# Patient Record
Sex: Female | Born: 1985 | Race: Black or African American | Hispanic: No | Marital: Single | Smoking: Former smoker
Health system: Southern US, Community
[De-identification: ages and names within clinical notes are randomized; demographics above are authoritative.]

## PROBLEM LIST (undated history)

## (undated) DIAGNOSIS — Z789 Other specified health status: Secondary | ICD-10-CM

## (undated) HISTORY — PX: NO PAST SURGERIES: SHX2092

## (undated) HISTORY — DX: Other specified health status: Z78.9

---
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
## 2008-09-28 ENCOUNTER — Emergency Department (HOSPITAL_COMMUNITY): Admission: EM | Admit: 2008-09-28 | Discharge: 2008-09-28 | Payer: Self-pay | Admitting: Family Medicine

## 2018-06-17 NOTE — L&D Delivery Note (Addendum)
OB/GYN Faculty Practice Delivery Note  Carol Greene is a 33 y.o. [id number] s/p SVD at [redacted]w[redacted]d. She was admitted for PPROM on 12/18.   ROM: 28h 17m with clear fluid GBS Status:  --/POSITIVE (12/18 2233) s/p IV PenG x6 doses Maximum Maternal Temperature: 99.7   Labor Progress: . Patient arrived at 3.5 cm dilation and was augmented with pitocin.   Delivery Date/Time: 06/05/2019 at 2115 Delivery: Called to room and patient was complete and pushing. Head delivered in ROA position. Loose nuchal cord x1 present, reduced after delivery. Shoulder and body delivered in usual fashion. Infant with spontaneous cry, placed on mother's abdomen, dried and stimulated. Cord clamped x 2 after 1-minute delay, and cut by FOB. Cord blood drawn. Placenta delivered spontaneously with gentle cord traction. Fundus firm with massage and Pitocin. Labia, perineum, vagina, and cervix inspected with no lacerations.   Placenta: spontaneous, intact, 3 vessel cord Complications: none Lacerations: none EBL: 150cc Analgesia: epdiural   Infant: APGAR (1 MIN): 7   APGAR (5 MINS): 9    Weight: 6834H  Demetrius Revel, MD PGY-3  Midwife attestation: I was gloved and present for delivery in its entirety and I agree with the above resident's note.  Lajean Manes, CNM 2:01 AM

## 2019-01-26 ENCOUNTER — Encounter: Payer: Self-pay | Admitting: General Practice

## 2019-01-26 ENCOUNTER — Ambulatory Visit (INDEPENDENT_AMBULATORY_CARE_PROVIDER_SITE_OTHER): Payer: Self-pay | Admitting: *Deleted

## 2019-01-26 ENCOUNTER — Other Ambulatory Visit: Payer: Self-pay

## 2019-01-26 VITALS — BP 122/81 | HR 112 | Temp 98.5°F | Ht 64.0 in | Wt 218.8 lb

## 2019-01-26 DIAGNOSIS — Z3480 Encounter for supervision of other normal pregnancy, unspecified trimester: Secondary | ICD-10-CM

## 2019-01-26 DIAGNOSIS — Z3201 Encounter for pregnancy test, result positive: Secondary | ICD-10-CM

## 2019-01-26 DIAGNOSIS — Z3200 Encounter for pregnancy test, result unknown: Secondary | ICD-10-CM

## 2019-01-26 LAB — POCT URINE PREGNANCY: Preg Test, Ur: POSITIVE — AB

## 2019-01-26 MED ORDER — ONE-A-DAY WOMENS PRENATAL 1 28-0.8-235 MG PO CAPS: 1.0000 | ORAL_CAPSULE | Freq: Every day | ORAL | 0 refills | Status: DC

## 2019-01-26 NOTE — Progress Notes (Signed)
   PRENATAL INTAKE SUMMARY  Carol Greene presents today New OB Nurse Interview.  OB History    Gravida  3   Para  2   Term  2   Preterm      AB      Living  2     SAB      TAB      Ectopic      Multiple      Live Births  2          I have reviewed the patient's medical, obstetrical, social, and family histories, medications, and available lab results.  SUBJECTIVE She has no unusual complaints.  OBJECTIVE Initial nurse interview (New OB).  EDD: 08/04/2019 by LMP GA:[redacted]w[redacted]d [id number]  GENERAL APPEARANCE: alert, well appearing, in no apparent distress, oriented to person, place and time   ASSESSMENT Normal pregnancy Positive Pregnancy test  PLAN Prenatal care Continue PNV-Sample One A Day given Lab work to be completed at next visit.  Derl Barrow, RN

## 2019-02-11 ENCOUNTER — Ambulatory Visit (INDEPENDENT_AMBULATORY_CARE_PROVIDER_SITE_OTHER): Payer: Medicaid Other | Admitting: Advanced Practice Midwife

## 2019-02-11 ENCOUNTER — Encounter: Payer: Self-pay | Admitting: General Practice

## 2019-02-11 ENCOUNTER — Other Ambulatory Visit: Payer: Self-pay

## 2019-02-11 ENCOUNTER — Other Ambulatory Visit (HOSPITAL_COMMUNITY)
Admission: RE | Admit: 2019-02-11 | Discharge: 2019-02-11 | Disposition: A | Discharge status: Home / Self Care | Payer: Medicaid Other | Source: Ambulatory Visit | Attending: Advanced Practice Midwife | Admitting: Advanced Practice Midwife

## 2019-02-11 VITALS — BP 116/76 | HR 104 | Temp 98.5°F | Wt 224.0 lb

## 2019-02-11 DIAGNOSIS — Z3480 Encounter for supervision of other normal pregnancy, unspecified trimester: Secondary | ICD-10-CM | POA: Insufficient documentation

## 2019-02-11 DIAGNOSIS — O99212 Obesity complicating pregnancy, second trimester: Secondary | ICD-10-CM | POA: Diagnosis not present

## 2019-02-11 DIAGNOSIS — Z3A15 15 weeks gestation of pregnancy: Secondary | ICD-10-CM | POA: Diagnosis not present

## 2019-02-11 DIAGNOSIS — O99210 Obesity complicating pregnancy, unspecified trimester: Secondary | ICD-10-CM

## 2019-02-11 DIAGNOSIS — Z87891 Personal history of nicotine dependence: Secondary | ICD-10-CM

## 2019-02-11 DIAGNOSIS — Z3482 Encounter for supervision of other normal pregnancy, second trimester: Secondary | ICD-10-CM | POA: Diagnosis not present

## 2019-02-11 MED ORDER — ASPIRIN EC 81 MG PO TBEC: 81.0000 mg | DELAYED_RELEASE_TABLET | Freq: Every day | ORAL | 2 refills | Status: DC

## 2019-02-11 NOTE — Progress Notes (Signed)
History:   Darleny Sem is a 33 y.o. G3P2002 at 22w1dby LMP being seen today for her first obstetrical visit.  Her obstetrical history is significant for obesity, term uncomplicated SVD x 2. Patient does not intend to breast feed. Pregnancy history fully reviewed.  Patient reports no complaints. She endorses history of 1/2 PPD cigarette smoking, discontinued about 3 weeks ago. She lives with her fiance and children. Denies SI, HI, IPV.     HISTORY: OB History  Gravida Para Term Preterm AB Living  '3 2 2 ' 0 0 2  SAB TAB Ectopic Multiple Live Births  0 0 0 0 2    # Outcome Date GA Lbr Len/2nd Weight Sex Delivery Anes PTL Lv  3 Current           2 Term 05/29/17 466w0d7 lb 6 oz (3.345 kg) M Vag-Spont EPI N LIV  1 Term 01/25/05 4058w0d lb 14 oz (3.572 kg) F Vag-Spont EPI N LIV    [place]  Past Medical History:  Diagnosis Date  . Medical history non-contributory    Past Surgical History:  Procedure Laterality Date  . NO PAST SURGERIES     No family history on file. Social History   Tobacco Use  . Smoking status: Never Smoker  . Smokeless tobacco: Never Used  Substance Use Topics  . Alcohol use: Not Currently  . Drug use: Yes    Types: Marijuana    Comment: 1 month ago   No Known Allergies Current Outpatient Medications on File Prior to Visit  Medication Sig Dispense Refill  . Prenat-Fe Carbonyl-FA-Omega 3 (ONE-A-DAY WOMENS PRENATAL 1) 28-0.8-235 MG CAPS Take 1 capsule by mouth daily. 30 capsule 0   No current facility-administered medications on file prior to visit.     Review of Systems Pertinent items noted in HPI and remainder of comprehensive ROS otherwise negative. Physical Exam:   Vitals:   02/11/19 0809  BP: 116/76  Pulse: (!) 104  Temp: 98.5 F (36.9 C)  Weight: 224 lb (101.6 kg)   Fetal Heart Rate (bpm): 146 Uterus:     Pelvic Exam: Perineum: no hemorrhoids, normal perineum   Vulva:  normal external genitalia, no lesions   Vagina:  normal mucosa, thick white discharge throughout vault, recent intercourse per patient   Cervix: no lesions and normal, pap smear done.    Adnexa: normal adnexa and no mass, fullness, tenderness   Bony Pelvis: average  System: General: well-developed, well-nourished female in no acute distress   Breasts:  normal appearance, no masses or tenderness bilaterally   Skin: normal coloration and turgor, no rashes   Neurologic: oriented, normal, negative, normal mood   Extremities: normal strength, tone, and muscle mass, ROM of all joints is normal   HEENT PERRLA, extraocular movement intact and sclera clear, anicteric   Mouth/Teeth mucous membranes moist, pharynx normal without lesions and dental hygiene good   Neck supple and no masses   Cardiovascular: regular rate and rhythm   Respiratory:  no respiratory distress, normal breath sounds   Abdomen: soft, non-tender; bowel sounds normal; no masses,  no organomegaly     Assessment:    Pregnancy: [id number] Active Problem List   Diagnosis Date Noted  . Supervision of other normal pregnancy, antepartum 01/26/2019     Indications for ASA therapy (per uptodate)  Two or more of the following: Nulliparity No Obesity (body mass  index >30 kg/m2) Yes Family history of preeclampsia in mother or sister No Age ?35 years No Sociodemographic characteristics (African American race, low socioeconomic level) Yes Personal risk factors (eg, previous pregnancy with low birth weight or small for gestational age infant, previous adverse pregnancy outcome [eg, stillbirth], interval >10 years between pregnancies) No   Plan:    1. Supervision of other normal pregnancy, antepartum - No complaints or concerns, routine care - Previous children delivered in [place], ROI completed this morning - Daily ASA 81 mg for PEC risk reduction. Rx to pharmacy - Obstetric Panel, Including HIV - Culture, OB Urine -  Genetic Screening - Cytology - PAP( [place]) - [place] MFM OB COMP + 34 WK; Future - Cervicovaginal ancillary only( [place]) - Enroll Patient in Babyscripts  2. Obesity affecting pregnancy in second trimester - Discussed target weight gain, meal planning - Hemoglobin A1c - TSH - Protein / creatinine ratio, urine - Comp Met (CMET)  Initial labs drawn. Continue prenatal vitamins. Genetic Screening discussed, First trimester screen, Quad screen and NIPS: ordered. Ultrasound discussed; fetal anatomic survey: ordered. Problem list reviewed and updated. The nature of [facility] with multiple MDs and other Advanced Practice Providers was explained to patient; also emphasized that residents, students are part of our team. Routine obstetric precautions reviewed. Return in about 4 weeks (around 03/11/2019) for Virtual 20 weeks appt .     Mallie Snooks, MSN, CNM Certified Nurse Midwife, [facility] for [employer], [place] [facility] 02/11/19 8:37 AM

## 2019-02-11 NOTE — Patient Instructions (Signed)

## 2019-02-11 NOTE — Progress Notes (Deleted)
.  baby

## 2019-02-12 ENCOUNTER — Encounter: Payer: Self-pay | Admitting: General Practice

## 2019-02-12 LAB — COMPREHENSIVE METABOLIC PANEL
ALT: 29 IU/L (ref 0–32)
AST: 30 IU/L (ref 0–40)
Albumin/Globulin Ratio: 2 (ref 1.2–2.2)
Albumin: 4.1 g/dL (ref 3.8–4.8)
Alkaline Phosphatase: 54 IU/L (ref 39–117)
BUN/Creatinine Ratio: 7 — ABNORMAL LOW (ref 9–23)
BUN: 4 mg/dL — ABNORMAL LOW (ref 6–20)
Bilirubin Total: <0.2 mg/dL (ref 0.0–1.2)
CO2: 20 mmol/L (ref 20–29)
Calcium: 9 mg/dL (ref 8.7–10.2)
Chloride: 102 mmol/L (ref 96–106)
Creatinine, Ser: 0.54 mg/dL — ABNORMAL LOW (ref 0.57–1.00)
GFR calc Af Amer: 144 mL/min/{1.73_m2} (ref >59)
GFR calc non Af Amer: 125 mL/min/{1.73_m2} (ref >59)
Globulin, Total: 2.1 g/dL (ref 1.5–4.5)
Glucose: 97 mg/dL (ref 65–99)
Potassium: 3.7 mmol/L (ref 3.5–5.2)
Sodium: 138 mmol/L (ref 134–144)
Total Protein: 6.2 g/dL (ref 6.0–8.5)

## 2019-02-12 LAB — OBSTETRIC PANEL, INCLUDING HIV
Antibody Screen: NEGATIVE
Basophils Absolute: 0 10*3/uL (ref 0.0–0.2)
Basos: 0 %
EOS (ABSOLUTE): 0.2 10*3/uL (ref 0.0–0.4)
Eos: 2 %
HIV Screen 4th Generation wRfx: NONREACTIVE
Hematocrit: 30.1 % — ABNORMAL LOW (ref 34.0–46.6)
Hemoglobin: 10.1 g/dL — ABNORMAL LOW (ref 11.1–15.9)
Hepatitis B Surface Ag: NEGATIVE
Immature Grans (Abs): 0.1 10*3/uL (ref 0.0–0.1)
Immature Granulocytes: 1 %
Lymphocytes Absolute: 2.4 10*3/uL (ref 0.7–3.1)
Lymphs: 30 %
MCH: 32.2 pg (ref 26.6–33.0)
MCHC: 33.6 g/dL (ref 31.5–35.7)
MCV: 96 fL (ref 79–97)
Monocytes Absolute: 0.6 10*3/uL (ref 0.1–0.9)
Monocytes: 7 %
Neutrophils Absolute: 4.9 10*3/uL (ref 1.4–7.0)
Neutrophils: 60 %
Platelets: 267 10*3/uL (ref 150–450)
RBC: 3.14 x10E6/uL — ABNORMAL LOW (ref 3.77–5.28)
RDW: 13.3 % (ref 11.7–15.4)
RPR Ser Ql: NONREACTIVE
Rh Factor: POSITIVE
Rubella Antibodies, IGG: 1.38 index (ref >0.99)
WBC: 8.1 10*3/uL (ref 3.4–10.8)

## 2019-02-12 LAB — PROTEIN / CREATININE RATIO, URINE
Creatinine, Urine: 50 mg/dL
Protein, Ur: 7.8 mg/dL
Protein/Creat Ratio: 156 mg/g creat (ref 0–200)

## 2019-02-12 LAB — TSH: TSH: 2.95 u[IU]/mL (ref 0.450–4.500)

## 2019-02-12 LAB — HEMOGLOBIN A1C
Est. average glucose Bld gHb Est-mCnc: 108 mg/dL
Hgb A1c MFr Bld: 5.4 % (ref 4.8–5.6)

## 2019-02-13 LAB — CULTURE, OB URINE

## 2019-02-13 LAB — URINE CULTURE, OB REFLEX: Organism ID, Bacteria: NO GROWTH

## 2019-02-15 LAB — CYTOLOGY - PAP
Diagnosis: NEGATIVE
HPV: DETECTED — AB

## 2019-02-17 ENCOUNTER — Encounter: Payer: Self-pay | Admitting: General Practice

## 2019-02-17 ENCOUNTER — Other Ambulatory Visit: Payer: Self-pay | Admitting: Advanced Practice Midwife

## 2019-02-17 DIAGNOSIS — B9689 Other specified bacterial agents as the cause of diseases classified elsewhere: Secondary | ICD-10-CM

## 2019-02-17 LAB — CERVICOVAGINAL ANCILLARY ONLY
Candida vaginitis: NEGATIVE
Chlamydia: NEGATIVE
Neisseria Gonorrhea: NEGATIVE
Trichomonas: NEGATIVE

## 2019-02-17 MED ORDER — METRONIDAZOLE 500 MG PO TABS: 500.0000 mg | ORAL_TABLET | Freq: Two times a day (BID) | ORAL | 0 refills | Status: DC

## 2019-02-17 NOTE — Progress Notes (Unsigned)
Positive BV 08/27. Rx to pharmacy on record. Pt notified via active [place], MSN, CNM Certified Nurse Midwife, [facility] for [employer], [place] [facility] 02/17/19 1:45 PM

## 2019-02-18 NOTE — Telephone Encounter (Signed)
Return call to patient regarding lab result. Pt wanted to know about the Panor[facility] results. Advised patient that results are not back yet, hopefully tomorrow. Once results are back, will give patient a call.   Derl Barrow, RN

## 2019-02-18 NOTE — Telephone Encounter (Signed)
-----   Message from Maurine Minister, [place] sent at 02/18/2019 10:54 AM EDT ----- Regarding: results Patient wants lab results explained to her.

## 2019-02-19 ENCOUNTER — Encounter: Payer: Self-pay | Admitting: General Practice

## 2019-03-09 ENCOUNTER — Other Ambulatory Visit: Payer: Self-pay | Admitting: *Deleted

## 2019-03-09 DIAGNOSIS — Z3480 Encounter for supervision of other normal pregnancy, unspecified trimester: Secondary | ICD-10-CM

## 2019-03-09 NOTE — Progress Notes (Signed)
Received denial from [facility] for previous ultrasound ordered ob complete greater than 14 weeks. Order changed to detailed ultrasound greater than 14 weeks.  Derl Barrow, RN

## 2019-03-10 ENCOUNTER — Ambulatory Visit (HOSPITAL_COMMUNITY)
Admission: RE | Admit: 2019-03-10 | Discharge: 2019-03-10 | Disposition: A | Discharge status: Home / Self Care | Payer: Medicaid Other | Source: Ambulatory Visit | Attending: Obstetrics and Gynecology | Admitting: Obstetrics and Gynecology

## 2019-03-10 ENCOUNTER — Encounter: Payer: Self-pay | Admitting: Advanced Practice Midwife

## 2019-03-10 ENCOUNTER — Telehealth (INDEPENDENT_AMBULATORY_CARE_PROVIDER_SITE_OTHER): Payer: Medicaid Other | Admitting: Advanced Practice Midwife

## 2019-03-10 ENCOUNTER — Encounter (HOSPITAL_COMMUNITY): Payer: Self-pay

## 2019-03-10 ENCOUNTER — Ambulatory Visit (HOSPITAL_COMMUNITY): Payer: Medicaid Other | Admitting: *Deleted

## 2019-03-10 ENCOUNTER — Other Ambulatory Visit: Payer: Self-pay

## 2019-03-10 DIAGNOSIS — Z3A23 23 weeks gestation of pregnancy: Secondary | ICD-10-CM | POA: Diagnosis not present

## 2019-03-10 DIAGNOSIS — O99212 Obesity complicating pregnancy, second trimester: Secondary | ICD-10-CM | POA: Diagnosis not present

## 2019-03-10 DIAGNOSIS — Z3480 Encounter for supervision of other normal pregnancy, unspecified trimester: Secondary | ICD-10-CM

## 2019-03-10 DIAGNOSIS — O289 Unspecified abnormal findings on antenatal screening of mother: Secondary | ICD-10-CM | POA: Diagnosis not present

## 2019-03-10 DIAGNOSIS — O99012 Anemia complicating pregnancy, second trimester: Secondary | ICD-10-CM

## 2019-03-10 DIAGNOSIS — O99210 Obesity complicating pregnancy, unspecified trimester: Secondary | ICD-10-CM | POA: Insufficient documentation

## 2019-03-10 DIAGNOSIS — Z3482 Encounter for supervision of other normal pregnancy, second trimester: Secondary | ICD-10-CM

## 2019-03-10 NOTE — Progress Notes (Signed)
   TELEHEALTH VIRTUAL OBSTETRICS VISIT ENCOUNTER NOTE  I connected with Carol Greene  on 03/10/19 at  2:50 PM EDT by telephone at home and verified that I am speaking with the correct person using two identifiers.   I discussed the limitations, risks, security and privacy concerns of performing an evaluation and management service by telephone and the availability of in person appointments. I also discussed with the patient that there may be a patient responsible charge related to this service. The patient expressed understanding and agreed to proceed.  Subjective:  Carol Greene is a 33 y.o. G3P2002 at [redacted]w[redacted]d being followed for ongoing prenatal care.  She is currently monitored for the following issues for this low-risk pregnancy and has Supervision of other normal pregnancy, antepartum; Obesity in pregnancy; and Hx of tobacco use, presenting hazards to health on their problem list.  Patient reports no complaints. Reports fetal movement. Denies any contractions, bleeding or leaking of fluid.   The following portions of the patient's history were reviewed and updated as appropriate: allergies, current medications, past family history, past medical history, past social history, past surgical history and problem list.   Objective:   General:  Alert, oriented and cooperative.   Mental Status: Normal mood and affect perceived. Normal judgment and thought content.  Rest of physical exam deferred due to type of encounter  Assessment and Plan:  Pregnancy: G3P2002 at [redacted]w[redacted]d 1. Supervision of other normal pregnancy, antepartum - Patient had anatomy scan today, no abnormal findings, declined amnio or genetic counselor  - CBC; Future - Glucose Tolerance, 2 Hours w/1 Hour; Future - RPR; Future - HIV antibody (with reflex); Future  Preterm labor symptoms and general obstetric precautions including but not limited to vaginal bleeding, contractions, leaking of fluid and fetal movement were reviewed in  detail with the patient.  I discussed the assessment and treatment plan with the patient. The patient was provided an opportunity to ask questions and all were answered. The patient agreed with the plan and demonstrated an understanding of the instructions. The patient was advised to call back or seek an in-person office evaluation/go to [facility] at [facility] for any urgent or concerning symptoms. Please refer to After Visit Summary for other counseling recommendations.   I provided 10 minutes of non-face-to-face time during this encounter.  Return in about 4 weeks (around 04/07/2019) for in person visit for 28 week labs and GTT .  No future appointments.  Marcille Buffy DNP, CNM  03/10/19  3:10 PM  [facility]

## 2019-04-07 ENCOUNTER — Ambulatory Visit (INDEPENDENT_AMBULATORY_CARE_PROVIDER_SITE_OTHER): Payer: Medicaid Other

## 2019-04-07 ENCOUNTER — Other Ambulatory Visit: Payer: Self-pay

## 2019-04-07 VITALS — BP 119/76 | HR 98 | Temp 99.0°F | Wt 228.0 lb

## 2019-04-07 DIAGNOSIS — M25559 Pain in unspecified hip: Secondary | ICD-10-CM

## 2019-04-07 DIAGNOSIS — O99210 Obesity complicating pregnancy, unspecified trimester: Secondary | ICD-10-CM

## 2019-04-07 DIAGNOSIS — Z3480 Encounter for supervision of other normal pregnancy, unspecified trimester: Secondary | ICD-10-CM | POA: Diagnosis not present

## 2019-04-07 DIAGNOSIS — O26892 Other specified pregnancy related conditions, second trimester: Secondary | ICD-10-CM

## 2019-04-07 DIAGNOSIS — Z23 Encounter for immunization: Secondary | ICD-10-CM

## 2019-04-07 DIAGNOSIS — O99012 Anemia complicating pregnancy, second trimester: Secondary | ICD-10-CM

## 2019-04-07 DIAGNOSIS — O99212 Obesity complicating pregnancy, second trimester: Secondary | ICD-10-CM

## 2019-04-07 DIAGNOSIS — Z3A27 27 weeks gestation of pregnancy: Secondary | ICD-10-CM

## 2019-04-07 MED ORDER — ONE-A-DAY WOMENS PRENATAL 1 28-0.8-235 MG PO CAPS: 1.0000 | ORAL_CAPSULE | Freq: Every day | ORAL | 11 refills | Status: AC

## 2019-04-07 NOTE — Addendum Note (Signed)
Addended by: Derl Barrow on: 04/07/2019 10:24 AM   Modules accepted: Orders

## 2019-04-07 NOTE — Progress Notes (Signed)
   PRENATAL VISIT NOTE  Subjective:  Carol Greene is a 33 y.o. G3P2002 at [redacted]w[redacted]d who presents today for routine prenatal care.  She is currently being monitored for supervision of a low-risk pregnancy with problems as listed below.  Patient endorses fetal movement.  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  She reports the pain has improved, at night, with usage of a body pillow. Patient denies vaginal concerns including discharge, bleeding, leaking, itching, and burning.   Patient Active Problem List   Diagnosis Date Noted  . Obesity in pregnancy 02/11/2019  . Hx of tobacco use, presenting hazards to health 02/11/2019  . Supervision of other normal pregnancy, antepartum 01/26/2019    The following portions of the patient's history were reviewed and updated as appropriate: allergies, current medications, past family history, past medical history, past social history, past surgical history and problem list. Problem list updated.  Objective:   Vitals:   04/07/19 0814  BP: 119/76  Pulse: 98  Temp: 99 F (37.2 C)  Weight: 228 lb (103.4 kg)    Fetal Status: Fetal Heart Rate (bpm): 140   Movement: Present     General:  Alert, oriented and cooperative. Patient is in no acute distress.  Skin: Skin is warm and dry.   Cardiovascular: Regular rate and rhythm.  Respiratory: Normal respiratory effort. CTA-Bilaterally  Abdomen: Soft, gravid, appropriate for gestational age.  Pelvic: Cervical exam deferred        Extremities: Normal range of motion.  Edema: None  Mental Status: Normal mood and affect. Normal behavior. Normal judgment and thought content.   Assessment and Plan:  Pregnancy: G3P2002 at [redacted]w[redacted]d  1. Supervision of other normal pregnancy, antepartum -Anticipatory guidance for upcoming appts. -Reviewed labs to be drawn today.  Informed that results to be released to mychart if normal, but would call and discuss abnormal results. -Reviewed vital signs particularly low grade  fever (99). Encouraged to monitor for new or worsening symptoms and report as appropriate.  - Glucose Tolerance, 2 Hours w/1 Hour - HIV Antibody (routine testing w rflx) - RPR - CBC - Tdap vaccine greater than or equal to 7yo IM  2. Pregnancy related hip pain in third trimester -Discussed usage of pregnancy support band/belt throughout the day. -Reviewed types and costs on Internet with patient. -Discussed usage of tylenol for relief of pain as well as warm showers/compresses and rest.    3. Need for immunization against influenza -Given Today - Flu Vaccine QUAD 36+ mos IM  4. Need for tetanus, diphtheria, and acellular pertussis (Tdap) vaccine in patient of adolescent age or older -Given Today   Preterm labor symptoms and general obstetric precautions including but not limited to vaginal bleeding, contractions, leaking of fluid and fetal movement were reviewed with the patient.  Please refer to After Visit Summary for other counseling recommendations.  Return in about 5 weeks (around 05/12/2019) for LR-ROB via Virtual Visit.  No future appointments.  Maryann Conners, CNM 04/07/2019, 8:33 AM

## 2019-04-08 LAB — CBC
Hematocrit: 28.4 % — ABNORMAL LOW (ref 34.0–46.6)
Hemoglobin: 9.4 g/dL — ABNORMAL LOW (ref 11.1–15.9)
MCH: 31.6 pg (ref 26.6–33.0)
MCHC: 33.1 g/dL (ref 31.5–35.7)
MCV: 96 fL (ref 79–97)
Platelets: 234 10*3/uL (ref 150–450)
RBC: 2.97 x10E6/uL — ABNORMAL LOW (ref 3.77–5.28)
RDW: 12.2 % (ref 11.7–15.4)
WBC: 10.4 10*3/uL (ref 3.4–10.8)

## 2019-04-08 LAB — GLUCOSE TOLERANCE, 2 HOURS W/ 1HR
Glucose, 1 hour: 203 mg/dL — ABNORMAL HIGH (ref 65–179)
Glucose, 2 hour: 137 mg/dL (ref 65–152)
Glucose, Fasting: 89 mg/dL (ref 65–91)

## 2019-04-08 LAB — RPR: RPR Ser Ql: NONREACTIVE

## 2019-04-08 LAB — HIV ANTIBODY (ROUTINE TESTING W REFLEX): HIV Screen 4th Generation wRfx: NONREACTIVE

## 2019-04-11 MED ORDER — FERROUS SULFATE 325 (65 FE) MG PO TABS: 325.0000 mg | ORAL_TABLET | Freq: Two times a day (BID) | ORAL | 1 refills | Status: AC

## 2019-04-11 NOTE — Progress Notes (Signed)
[name],  This patient needs to be set up with diabetic education, a glucometer, and strips.  I have notified her via mychart of her results, but please reiterate when you call to schedule her referral.  Thanks,  Janett Billow

## 2019-04-11 NOTE — Addendum Note (Signed)
Addended by: Gavin Pound L on: 04/11/2019 03:52 AM   Modules accepted: Orders

## 2019-04-12 ENCOUNTER — Other Ambulatory Visit: Payer: Self-pay | Admitting: *Deleted

## 2019-04-12 DIAGNOSIS — Z3480 Encounter for supervision of other normal pregnancy, unspecified trimester: Secondary | ICD-10-CM

## 2019-04-12 DIAGNOSIS — O24419 Gestational diabetes mellitus in pregnancy, unspecified control: Secondary | ICD-10-CM | POA: Insufficient documentation

## 2019-04-12 DIAGNOSIS — O24410 Gestational diabetes mellitus in pregnancy, diet controlled: Secondary | ICD-10-CM

## 2019-04-12 MED ORDER — ACCU-CHEK GUIDE W/DEVICE KIT: 1.0000 | PACK | Freq: Four times a day (QID) | 0 refills | Status: AC

## 2019-04-12 MED ORDER — ACCU-CHEK AVIVA PLUS VI STRP: ORAL_STRIP | 12 refills | Status: DC

## 2019-04-12 MED ORDER — ACCU-CHEK FASTCLIX LANCETS MISC: 1.0000 | Freq: Four times a day (QID) | 12 refills | Status: AC

## 2019-04-12 NOTE — Progress Notes (Signed)
Diabetic supplies sent to pharmacy per Gavin Pound, CNM.  Derl Barrow, RN

## 2019-04-13 ENCOUNTER — Encounter: Payer: Self-pay | Admitting: General Practice

## 2019-04-22 ENCOUNTER — Other Ambulatory Visit: Payer: Self-pay | Admitting: Lactation Services

## 2019-04-22 ENCOUNTER — Ambulatory Visit: Payer: Medicaid Other | Admitting: *Deleted

## 2019-04-22 ENCOUNTER — Encounter: Payer: Medicaid Other | Attending: Obstetrics and Gynecology | Admitting: *Deleted

## 2019-04-22 ENCOUNTER — Other Ambulatory Visit: Payer: Self-pay

## 2019-04-22 DIAGNOSIS — O24419 Gestational diabetes mellitus in pregnancy, unspecified control: Secondary | ICD-10-CM | POA: Diagnosis not present

## 2019-04-22 DIAGNOSIS — Z713 Dietary counseling and surveillance: Secondary | ICD-10-CM | POA: Insufficient documentation

## 2019-04-22 MED ORDER — ACCU-CHEK GUIDE VI STRP: ORAL_STRIP | 12 refills | Status: AC

## 2019-04-22 NOTE — Progress Notes (Signed)
  Patient was seen on 04/22/2019 for Gestational Diabetes self-management. EDD 07/06/2019. Patient states no history of GDM. Diet history obtained. Patient eats fair variety of all food groups but typically skips either breakfast or lunch and her dinner meal is typically after 9 PM. 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  Beverages include water and milk with occasional OJ.  The following learning objectives were met by the patient :   States the definition of Gestational Diabetes  States why dietary management is important in controlling blood glucose  Describes the effects of carbohydrates on blood glucose levels  Demonstrates ability to create a balanced meal plan  Demonstrates carbohydrate counting   States when to check blood glucose levels  Demonstrates proper blood glucose monitoring techniques  States the effect of stress and exercise on blood glucose levels  States the importance of limiting caffeine and abstaining from alcohol and smoking  Plan:  Aim for 3 Carb Choices per meal (45 grams) +/- 1 either way  Aim for 1-2 Carbs per snack Begin reading food labels for Total Carbohydrate of foods If OK with your MD, consider  increasing your activity level by walking, Arm Chair Exercises or other activity daily as tolerated Begin checking BG before breakfast and 2 hours after first bite of breakfast, lunch and dinner as directed by MD  Bring Log Book/Sheet and meter to every medical appointment OR use Baby Scripts (see below) Baby Scripts:  Patient was introduced to [employer] and plans to use as record of BG electronically  Take medication if directed by MD  Blood glucose monitor Rx called into pharmacy by her OB office but she states the pharmacist stated it would cost $100 so she did not pick it up. I have provided her with the BIN# flyer to take back to her pharmacy for a free meter: Accu Check Guide with Fast Clix drums Patient  instructed to test pre breakfast and 2 hours after each meal as directed by MD  Patient instructed to monitor glucose levels: FBS: 60 - 95 mg/dl 2 hour: <120 mg/dl  Patient received the following handouts:  Nutrition Diabetes and Pregnancy  Carbohydrate Counting List  Patient will be seen for follow-up as needed.

## 2019-04-30 ENCOUNTER — Encounter: Payer: Self-pay | Admitting: Family Medicine

## 2019-05-05 ENCOUNTER — Other Ambulatory Visit: Payer: Self-pay | Admitting: *Deleted

## 2019-05-05 DIAGNOSIS — O24419 Gestational diabetes mellitus in pregnancy, unspecified control: Secondary | ICD-10-CM

## 2019-05-12 ENCOUNTER — Ambulatory Visit (INDEPENDENT_AMBULATORY_CARE_PROVIDER_SITE_OTHER): Payer: Medicaid Other | Admitting: Obstetrics and Gynecology

## 2019-05-12 ENCOUNTER — Other Ambulatory Visit: Payer: Self-pay

## 2019-05-12 VITALS — BP 107/67 | HR 108 | Temp 98.2°F | Wt 230.8 lb

## 2019-05-12 DIAGNOSIS — Z3480 Encounter for supervision of other normal pregnancy, unspecified trimester: Secondary | ICD-10-CM

## 2019-05-12 DIAGNOSIS — Z3A32 32 weeks gestation of pregnancy: Secondary | ICD-10-CM

## 2019-05-12 DIAGNOSIS — O99210 Obesity complicating pregnancy, unspecified trimester: Secondary | ICD-10-CM

## 2019-05-12 DIAGNOSIS — O24410 Gestational diabetes mellitus in pregnancy, diet controlled: Secondary | ICD-10-CM

## 2019-05-12 DIAGNOSIS — O99213 Obesity complicating pregnancy, third trimester: Secondary | ICD-10-CM

## 2019-05-13 NOTE — Progress Notes (Signed)
    [facility] PREGNANCY OFFICE VISIT Patient name: Carol Greene MRN [id number]  Date of birth: June 03, 1986 Chief Complaint:   Routine Prenatal Visit  History of Present Illness:   Carol Greene is a 33 y.o. G74P2002 female at [redacted]w[redacted]d with an Estimated Date of Delivery: 07/06/19 being seen today for ongoing management of a high-risk pregnancy complicated by B8GY Today she reports no complaints.She reports she does not like the diabetic diet, because she feels "like she is starving the baby." She has not been logging her blood sugars in BabyScripts, but she did bring a written log. Contractions: Irregular and (+) pelvic pressure. Vag. Bleeding: None.  Movement: Present. denies leaking of fluid.  Review of Systems:   Pertinent items are noted in HPI Denies abnormal vaginal discharge w/ itching/odor/irritation, headaches, visual changes, shortness of breath, chest pain, abdominal pain, severe nausea/vomiting, or problems with urination or bowel movements unless otherwise stated above. Pertinent History Reviewed:  Reviewed past medical,surgical, social, obstetrical and family history.  Reviewed problem list, medications and allergies. Physical Assessment:   Vitals:   05/12/19 1107  BP: 107/67  Pulse: (!) 108  Temp: 98.2 F (36.8 C)  Weight: 230 lb 12.8 oz (104.7 kg)  Body mass index is 39.62 kg/m.           Physical Examination:   General appearance: alert, well appearing, and in no distress  Mental status: alert, oriented to person, place, and time  Skin: warm & dry   Extremities: Edema: None    Cardiovascular: normal heart rate noted  Respiratory: normal respiratory effort, no distress  Abdomen: gravid, soft, non-tender  Pelvic: Cervical exam deferred    Fetal Status: Fetal Heart Rate (bpm): 147 Fundal Height: 35 cm Movement: Present    Fetal Surveillance Testing today: none   Assessment & Plan:  1) High-risk pregnancy G3P2002 at [redacted]w[redacted]d with an Estimated Date of Delivery: 07/06/19    2) Supervision of other normal pregnancy, antepartum  3) Diet controlled gestational diabetes mellitus (GDM), antepartum  - Review BS log: FBS range = 74-106 (5 out of 9 values over 95)  2 hr PP range = 70-118 - Extensive discussion about food choices and logging blood sugars in Baby Scripts  Had appt with diabetic educator 04/22/19 - Advised that if FBS ranges continue to be too high, will have to start on Metformin and transfer to HR office. - [place] MFM OB FOLLOW UP @ 36 wks  4) Obesity in pregnancy   Meds: none  Labs/procedures today: none  Treatment Plan:  Restructure food choices and begin logging BS in [employer]  Reviewed: Preterm labor symptoms and general obstetric precautions including but not limited to vaginal bleeding, contractions, leaking of fluid and fetal movement were reviewed in detail with the patient.  All questions were answered. Has home bp cuff. Check bp weekly, let us know if >140/90.   Follow-up: Return in about 2 weeks (around 05/26/2019) for Return OB visit.  Orders Placed This Encounter  Procedures  . [place] MFM OB FOLLOW UP   Laury Deep MSN, CNM 05/12/2019

## 2019-05-18 ENCOUNTER — Encounter: Payer: Self-pay | Admitting: General Practice

## 2019-05-28 ENCOUNTER — Other Ambulatory Visit: Payer: Self-pay

## 2019-05-28 ENCOUNTER — Ambulatory Visit (INDEPENDENT_AMBULATORY_CARE_PROVIDER_SITE_OTHER): Payer: Medicaid Other | Admitting: Advanced Practice Midwife

## 2019-05-28 ENCOUNTER — Encounter: Payer: Self-pay | Admitting: Advanced Practice Midwife

## 2019-05-28 VITALS — BP 114/77 | HR 86 | Wt 229.0 lb

## 2019-05-28 DIAGNOSIS — O24419 Gestational diabetes mellitus in pregnancy, unspecified control: Secondary | ICD-10-CM

## 2019-05-28 DIAGNOSIS — O0993 Supervision of high risk pregnancy, unspecified, third trimester: Secondary | ICD-10-CM

## 2019-05-28 DIAGNOSIS — O0990 Supervision of high risk pregnancy, unspecified, unspecified trimester: Secondary | ICD-10-CM

## 2019-05-28 DIAGNOSIS — Z3A34 34 weeks gestation of pregnancy: Secondary | ICD-10-CM

## 2019-05-28 NOTE — Progress Notes (Signed)
   PRENATAL VISIT NOTE  Subjective:  Carol Greene is a 33 y.o. G3P2002 at [redacted]w[redacted]d being seen today for ongoing prenatal care.  She is currently monitored for the following issues for this high-risk pregnancy and has Supervision of other normal pregnancy, antepartum; Obesity in pregnancy; Hx of tobacco use, presenting hazards to health; and Gestational diabetes mellitus (GDM), antepartum on their problem list.  Patient reports no complaints.  Contractions: Not present.  .  Movement: Present. Denies leaking of fluid.   The following portions of the patient's history were reviewed and updated as appropriate: allergies, current medications, past family history, past medical history, past social history, past surgical history and problem list.   Objective:   Vitals:   05/28/19 0859  BP: 114/77  Pulse: 86  Weight: 229 lb (103.9 kg)    Fetal Status: Fetal Heart Rate (bpm): 144 Fundal Height: 35 cm Movement: Present     General:  Alert, oriented and cooperative. Patient is in no acute distress.  Skin: Skin is warm and dry. No rash noted.   Cardiovascular: Normal heart rate noted  Respiratory: Normal respiratory effort, no problems with respiration noted  Abdomen: Soft, gravid, appropriate for gestational age.  Pain/Pressure: Absent     Pelvic: Cervical exam deferred        Extremities: Normal range of motion.  Edema: None  Mental Status: Normal mood and affect. Normal behavior. Normal judgment and thought content.   Assessment and Plan:  Pregnancy: G3P2002 at [redacted]w[redacted]d 1. Supervision of high risk pregnancy, antepartum   2. Gestational diabetes mellitus (GDM) in third trimester, gestational diabetes method of control unspecified - Patient reports that her blood sugars have improved. She has been using better portion control and food choices at this time.  - Patient has not checked fasting glucose since her last visit.  - All PP except one have been in range. 1 out of 36. Only one fasting  level since last visit, but it is less than 90.  - Will have patient schedule virtual visit for one week so we can FU with her fasting levels.   Preterm labor symptoms and general obstetric precautions including but not limited to vaginal bleeding, contractions, leaking of fluid and fetal movement were reviewed in detail with the patient. Please refer to After Visit Summary for other counseling recommendations.   Return in about 2 weeks (around 06/11/2019) for In person visit for GBS/36 week labs .  Future Appointments  Date Time Provider [place]  06/09/2019  9:50 AM Jorje Guild, NP [facility] None  06/09/2019 11:15 AM [place] 4 [facility]  06/09/2019 11:20 AM [place] NURSE [place] [facility]  06/24/2019 11:10 AM Laury Deep, CNM [facility] None  07/01/2019 10:10 AM Laury Deep, CNM [facility] None   Marcille Buffy DNP, CNM  05/28/19  9:30 AM

## 2019-05-28 NOTE — Progress Notes (Signed)
Patient reported she does not take Aspirin everyday.

## 2019-06-04 ENCOUNTER — Encounter (HOSPITAL_COMMUNITY): Payer: Self-pay | Admitting: Obstetrics & Gynecology

## 2019-06-04 ENCOUNTER — Other Ambulatory Visit: Payer: Self-pay

## 2019-06-04 ENCOUNTER — Inpatient Hospital Stay (HOSPITAL_COMMUNITY)
Admission: AD | Admit: 2019-06-04 | Discharge: 2019-06-07 | DRG: 805 | Disposition: A | Discharge status: Home / Self Care | Payer: Medicaid Other | Attending: Obstetrics and Gynecology | Admitting: Obstetrics and Gynecology

## 2019-06-04 DIAGNOSIS — O42919 Preterm premature rupture of membranes, unspecified as to length of time between rupture and onset of labor, unspecified trimester: Secondary | ICD-10-CM

## 2019-06-04 DIAGNOSIS — Z3A35 35 weeks gestation of pregnancy: Secondary | ICD-10-CM

## 2019-06-04 DIAGNOSIS — O42913 Preterm premature rupture of membranes, unspecified as to length of time between rupture and onset of labor, third trimester: Principal | ICD-10-CM | POA: Diagnosis present

## 2019-06-04 DIAGNOSIS — O24420 Gestational diabetes mellitus in childbirth, diet controlled: Secondary | ICD-10-CM | POA: Diagnosis present

## 2019-06-04 DIAGNOSIS — O99824 Streptococcus B carrier state complicating childbirth: Secondary | ICD-10-CM | POA: Diagnosis not present

## 2019-06-04 DIAGNOSIS — Z20828 Contact with and (suspected) exposure to other viral communicable diseases: Secondary | ICD-10-CM | POA: Diagnosis present

## 2019-06-04 DIAGNOSIS — Z87891 Personal history of nicotine dependence: Secondary | ICD-10-CM

## 2019-06-04 DIAGNOSIS — O9902 Anemia complicating childbirth: Secondary | ICD-10-CM | POA: Diagnosis not present

## 2019-06-04 DIAGNOSIS — D649 Anemia, unspecified: Secondary | ICD-10-CM | POA: Diagnosis not present

## 2019-06-04 DIAGNOSIS — O99214 Obesity complicating childbirth: Secondary | ICD-10-CM | POA: Diagnosis present

## 2019-06-04 DIAGNOSIS — Z3480 Encounter for supervision of other normal pregnancy, unspecified trimester: Secondary | ICD-10-CM

## 2019-06-04 DIAGNOSIS — O24429 Gestational diabetes mellitus in childbirth, unspecified control: Secondary | ICD-10-CM | POA: Diagnosis not present

## 2019-06-04 DIAGNOSIS — O24419 Gestational diabetes mellitus in pregnancy, unspecified control: Secondary | ICD-10-CM | POA: Diagnosis present

## 2019-06-04 DIAGNOSIS — O99210 Obesity complicating pregnancy, unspecified trimester: Secondary | ICD-10-CM

## 2019-06-04 HISTORY — DX: Preterm premature rupture of membranes, unspecified as to length of time between rupture and onset of labor, unspecified trimester: O42.919

## 2019-06-04 LAB — CBC
HCT: 28 % — ABNORMAL LOW (ref 36.0–46.0)
Hemoglobin: 9.4 g/dL — ABNORMAL LOW (ref 12.0–15.0)
MCH: 31.9 pg (ref 26.0–34.0)
MCHC: 33.6 g/dL (ref 30.0–36.0)
MCV: 94.9 fL (ref 80.0–100.0)
Platelets: 203 10*3/uL (ref 150–400)
RBC: 2.95 MIL/uL — ABNORMAL LOW (ref 3.87–5.11)
RDW: 12.3 % (ref 11.5–15.5)
WBC: 9.3 10*3/uL (ref 4.0–10.5)
nRBC: 0 % (ref 0.0–0.2)

## 2019-06-04 LAB — RESPIRATORY PANEL BY RT PCR (FLU A&B, COVID)
Influenza A by PCR: NEGATIVE
Influenza B by PCR: NEGATIVE
SARS Coronavirus 2 by RT PCR: NEGATIVE

## 2019-06-04 LAB — TYPE AND SCREEN
ABO/RH(D): O POS
Antibody Screen: NEGATIVE

## 2019-06-04 LAB — POCT FERN TEST: POCT Fern Test: POSITIVE

## 2019-06-04 MED ORDER — FENTANYL CITRATE (PF) 100 MCG/2ML IJ SOLN: 50.0000 ug | INTRAMUSCULAR | Status: DC | PRN

## 2019-06-04 MED ORDER — SOD CITRATE-CITRIC ACID 500-334 MG/5ML PO SOLN: 30.0000 mL | ORAL | Status: DC | PRN

## 2019-06-04 MED ORDER — OXYTOCIN 40 UNITS IN NORMAL SALINE INFUSION - SIMPLE MED
2.5000 [IU]/h | INTRAVENOUS | Status: DC
  Filled 2019-06-04: qty 1000

## 2019-06-04 MED ORDER — OXYTOCIN BOLUS FROM INFUSION
500.0000 mL | Freq: Once | INTRAVENOUS | Status: AC
  Administered 2019-06-05: 21:00:00 500 mL via INTRAVENOUS

## 2019-06-04 MED ORDER — FLEET ENEMA 7-19 GM/118ML RE ENEM: 1.0000 | ENEMA | RECTAL | Status: DC | PRN

## 2019-06-04 MED ORDER — LACTATED RINGERS IV SOLN: INTRAVENOUS | Status: DC

## 2019-06-04 MED ORDER — ONDANSETRON HCL 4 MG/2ML IJ SOLN: 4.0000 mg | Freq: Four times a day (QID) | INTRAMUSCULAR | Status: DC | PRN

## 2019-06-04 MED ORDER — LACTATED RINGERS IV SOLN
500.0000 mL | INTRAVENOUS | Status: DC | PRN
  Administered 2019-06-05 (×2): 500 mL via INTRAVENOUS

## 2019-06-04 MED ORDER — OXYCODONE-ACETAMINOPHEN 5-325 MG PO TABS: 2.0000 | ORAL_TABLET | ORAL | Status: DC | PRN

## 2019-06-04 MED ORDER — LIDOCAINE HCL (PF) 1 % IJ SOLN: 30.0000 mL | INTRAMUSCULAR | Status: DC | PRN

## 2019-06-04 MED ORDER — ACETAMINOPHEN 325 MG PO TABS: 650.0000 mg | ORAL_TABLET | ORAL | Status: DC | PRN

## 2019-06-04 MED ORDER — OXYCODONE-ACETAMINOPHEN 5-325 MG PO TABS: 1.0000 | ORAL_TABLET | ORAL | Status: DC | PRN

## 2019-06-04 MED ORDER — SODIUM CHLORIDE 0.9 % IV SOLN
5.0000 10*6.[IU] | Freq: Once | INTRAVENOUS | Status: AC
  Administered 2019-06-05: 5 10*6.[IU] via INTRAVENOUS
  Filled 2019-06-04: qty 5

## 2019-06-04 MED ORDER — HYDROXYZINE HCL 50 MG PO TABS
50.0000 mg | ORAL_TABLET | Freq: Four times a day (QID) | ORAL | Status: DC | PRN
  Filled 2019-06-04: qty 1

## 2019-06-04 MED ORDER — PENICILLIN G POT IN DEXTROSE 60000 UNIT/ML IV SOLN
3.0000 10*6.[IU] | INTRAVENOUS | Status: DC
  Administered 2019-06-05 (×5): 3 10*6.[IU] via INTRAVENOUS
  Filled 2019-06-04 (×7): qty 50

## 2019-06-04 NOTE — MAU Note (Addendum)
Up washing dishes and went to sit down and felt leaking at 4:30 pm.  Then used the bathroom, then there was more leaking.  Then saw mucus plug coming out and more leaking came out. Clear fluid.  No bleeding. Baby moving well.  Having braxton hicks ctxs for a while and today got a little stronger.

## 2019-06-04 NOTE — H&P (Addendum)
OBSTETRIC ADMISSION HISTORY AND PHYSICAL  Linnet Bottari is a 33 y.o. female G3P2002 with IUP at 62w3dby LMP presenting for PPROM. She had leaking sensation starting today around 1630 w/ clear fluid running down her leg. Over the next few hours she lost her mucus plug and started to develop more intense CTX so came to MAU for evaluation. In MAU she was found to be grossly ruptured w/ + ferning. She has been checking glucose levels at home, have remained in normal range. She reports +FMs, no VB, no blurry vision, headaches or peripheral edema, and RUQ pain.  She plans on bottle feeding. She request inpatient nexplanon for birth control. 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 Medical history non-contributory     Past Surgical History: Past Surgical History:  Procedure Laterality Date  . NO PAST SURGERIES      Obstetrical History: OB History    Gravida  3   Para  2   Term  2   Preterm      AB      Living  2     SAB      TAB      Ectopic      Multiple      Live Births  2           Social History: Social History   Socioeconomic History  . Marital status: Single    Spouse name: Not on file  . Number of children: 2  . Years of education: 12 . Highest education level: High school graduate  Occupational History  . Occupation: Healthcare  Tobacco Use  . Smoking status: Former SResearch scientist (life sciences) . Smokeless tobacco: Never Used  Substance and Sexual Activity  . Alcohol use: Not Currently  . Drug use: Not Currently    Types:  Marijuana    Comment: quit about one year ago  . Sexual activity: Yes    Birth control/protection: None  Other Topics Concern  . Not on file  Social History Narrative  . Not on file   Social Determinants of Health   Financial Resource Strain: Low Risk   . Difficulty of Paying Living Expenses: Not hard at all  Food Insecurity: No Food Insecurity  . Worried About RCharity fundraiserin the Last Year: Never true  . Ran Out of Food in the Last Year: Never true  Transportation Needs: No Transportation Needs  . Lack of Transportation (Medical): No  . Lack of Transportation (Non-Medical): No  Physical Activity: Sufficiently Active  . Days of Exercise per Week: 5 days  . Minutes of Exercise per Session: 30 min  Stress: No Stress Concern Present  . Feeling of Stress : Not at all  Social Connections: Slightly Isolated  . Frequency of Communication with Friends and Family: More than three times a week  . Frequency of Social Gatherings with Friends and Family: More than three times a week  . Attends Religious Services: More than 4 times per year  . Active Member of Clubs or Organizations: No  .  Attends Archivist Meetings: Never  . Marital Status: Living with partner    Family History: History reviewed. No pertinent family history.  Allergies: No Known Allergies  Medications Prior to Admission  Medication Sig Dispense Refill Last Dose  . Accu-Chek FastClix Lancets MISC 1 each by Percutaneous route 4 (four) times daily. ICD-10 code: O24.419 100 each 12 06/04/2019 at Unknown time  . aspirin EC 81 MG tablet Take 1 tablet (81 mg total) by mouth daily. Take after 12 weeks for prevention of preeclampsia later in pregnancy 300 tablet 2 06/04/2019 at Unknown time  . Blood Glucose Monitoring Suppl (ACCU-CHEK GUIDE) w/Device KIT 1 kit by Does not apply route QID. ICD-10 code: O24.419 1 kit 0 06/04/2019 at Unknown time  . ferrous sulfate 325 (65 FE) MG tablet Take 1 tablet (325 mg  total) by mouth 2 (two) times daily with a meal. 60 tablet 1 06/04/2019 at Unknown time  . glucose blood (ACCU-CHEK GUIDE) test strip Use as instructed 100 each 12 06/04/2019 at Unknown time  . Prenat-Fe Carbonyl-FA-Omega 3 (ONE-A-DAY WOMENS PRENATAL 1) 28-0.8-235 MG CAPS Take 1 capsule by mouth daily. 30 capsule 11 06/04/2019 at Unknown time  . metroNIDAZOLE (FLAGYL) 500 MG tablet Take 1 tablet (500 mg total) by mouth 2 (two) times daily. 14 tablet 0      Review of Systems   All systems reviewed and negative except as stated in HPI  Blood pressure 118/69, pulse (!) 106, temperature 98.4 F (36.9 C), temperature source Oral, resp. rate 16, height '5\' 4"'  (1.626 m), weight 103.9 kg, last menstrual period 10/28/2018, SpO2 99 %. General appearance: alert, cooperative, appears stated age, no distress and moderately obese Lungs: clear to auscultation bilaterally Heart: regular rate and rhythm Abdomen: soft, non-tender; bowel sounds normal Pelvic: deferred given PPROM Extremities: Homans sign is negative, no sign of DVT Presentation: cephalic, per eval in MAU Fetal monitoringBaseline: 145 bpm, Variability: Good {> 6 bpm), Accelerations: Reactive and Decelerations: Absent Uterine activity occasional contractions, irritability Dilation: 3.5 Effacement (%): 40 Station: Ballotable Exam by:: Alycia Rossetti RN   Prenatal labs: ABO, Rh: O/Positive/-- (08/27 0836) Antibody: Negative (08/27 0836) Rubella: 1.38 (08/27 0836) RPR: Non Reactive (10/21 0818)  HBsAg: Negative (08/27 0836)  HIV: Non Reactive (10/21 0818)  GBS:   unknown 2 hr Glucola: abnormal - 89, 203, 137 Genetic screening  Atypical sex chromosomes, otherwise low risk Anatomy US normal  Prenatal Transfer Tool  Maternal Diabetes: Yes:  Diabetes Type:  Diet controlled Genetic Screening: Abnormal:  Results: Other:atypical sex chromosomes Maternal Ultrasounds/Referrals: Normal Fetal Ultrasounds or other Referrals:  Referred to  Materal Fetal Medicine  Maternal Substance Abuse:  No Significant Maternal Medications:  None Significant Maternal Lab Results: None  Results for orders placed or performed during the hospital encounter of 06/04/19 (from the past 24 hour(s))  Fern Test   Collection Time: 06/04/19 10:08 PM  Result Value Ref Range   POCT Fern Test Positive = ruptured amniotic membanes     Patient Active Problem List   Diagnosis Date Noted  . Preterm premature rupture of membranes (PPROM) with unknown onset of labor 06/04/2019  . Gestational diabetes mellitus (GDM), antepartum 04/12/2019  . Obesity in pregnancy 02/11/2019  . Hx of tobacco use, presenting hazards to health 02/11/2019  . Supervision of other normal pregnancy, antepartum 01/26/2019    Assessment/Plan:  Tomara Youngberg is a 33 y.o. G3P2002 at 34w3dhere for PPROM.  #Labor: SROM at home around 1630, feels like her CTX are  getting more intense. They are occurring every few minuted per pt, difficulty tracing on toco. Will cont to monitor w/o augmentation and repeat cervical exam around 0230. Start pitocin if has not progressed. Anticipate vaginal delivery.  #Pain: Would like to try fentanyl initially but plans on getting epidural eventually.  #FWB: Cat 1 FHT, continuous monitoring. #ID:  GBS status unknown. Start IV PenG q4h given < [redacted] wks gestation. PCR collected in MAU.  COVID screen pending.  #MOF: bottle #MOC: inpatient nexplanon #A1gDM: Check BG q4h in latent labor and q2h in active labor.  Demetrius Revel, MD  06/05/2019, 00:17 AM  I confirm that I have verified the information documented in the resident's note and that I have also personally reperformed the history, physical exam and all medical decision making activities of this service and have verified that all service and findings are accurately documented in this student's note.   Patient in SOL after PPROM - will continue to monitor with out augmentation at this time d/t continued  cervical change  Dilation: 6.5 Effacement (%): 70 Cervical Position: Posterior Station: -3 Presentation: Vertex Exam by:: [employer] RN   Lajean Manes, CNM 06/05/2019 2:29 AM

## 2019-06-05 ENCOUNTER — Inpatient Hospital Stay (HOSPITAL_COMMUNITY): Payer: Medicaid Other | Admitting: Anesthesiology

## 2019-06-05 ENCOUNTER — Encounter (HOSPITAL_COMMUNITY): Payer: Self-pay | Admitting: Student

## 2019-06-05 DIAGNOSIS — Z3A35 35 weeks gestation of pregnancy: Secondary | ICD-10-CM

## 2019-06-05 DIAGNOSIS — O99824 Streptococcus B carrier state complicating childbirth: Secondary | ICD-10-CM

## 2019-06-05 DIAGNOSIS — O24420 Gestational diabetes mellitus in childbirth, diet controlled: Secondary | ICD-10-CM

## 2019-06-05 LAB — GLUCOSE, CAPILLARY
Glucose-Capillary: 84 mg/dL (ref 70–99)
Glucose-Capillary: 97 mg/dL (ref 70–99)
Glucose-Capillary: 83 mg/dL (ref 70–99)
Glucose-Capillary: 61 mg/dL — ABNORMAL LOW (ref 70–99)
Glucose-Capillary: 70 mg/dL (ref 70–99)
Glucose-Capillary: 85 mg/dL (ref 70–99)

## 2019-06-05 LAB — RPR: RPR Ser Ql: NONREACTIVE

## 2019-06-05 LAB — ABO/RH: ABO/RH(D): O POS

## 2019-06-05 LAB — GROUP B STREP BY PCR: Group B strep by PCR: POSITIVE — AB

## 2019-06-05 MED ORDER — FERROUS SULFATE 325 (65 FE) MG PO TABS
325.0000 mg | ORAL_TABLET | Freq: Two times a day (BID) | ORAL | Status: DC
  Administered 2019-06-05 (×2): 325 mg via ORAL
  Filled 2019-06-05 (×2): qty 1

## 2019-06-05 MED ORDER — OXYTOCIN 40 UNITS IN NORMAL SALINE INFUSION - SIMPLE MED
1.0000 m[IU]/min | INTRAVENOUS | Status: DC
  Administered 2019-06-05: 05:00:00 2 m[IU]/min via INTRAVENOUS
  Administered 2019-06-05: 20:00:00 4 m[IU]/min via INTRAVENOUS

## 2019-06-05 MED ORDER — TERBUTALINE SULFATE 1 MG/ML IJ SOLN: 0.2500 mg | Freq: Once | INTRAMUSCULAR | Status: DC | PRN

## 2019-06-05 MED ORDER — SODIUM CHLORIDE (PF) 0.9 % IJ SOLN
INTRAMUSCULAR | Status: DC | PRN
  Administered 2019-06-05: 12 mL/h via EPIDURAL

## 2019-06-05 MED ORDER — LACTATED RINGERS IV SOLN: 500.0000 mL | Freq: Once | INTRAVENOUS | Status: DC

## 2019-06-05 MED ORDER — PRENATAL MULTIVITAMIN CH: 1.0000 | ORAL_TABLET | Freq: Every day | ORAL | Status: DC

## 2019-06-05 MED ORDER — DIPHENHYDRAMINE HCL 50 MG/ML IJ SOLN: 12.5000 mg | INTRAMUSCULAR | Status: DC | PRN

## 2019-06-05 MED ORDER — PHENYLEPHRINE 40 MCG/ML (10ML) SYRINGE FOR IV PUSH (FOR BLOOD PRESSURE SUPPORT): 80.0000 ug | PREFILLED_SYRINGE | INTRAVENOUS | Status: DC | PRN

## 2019-06-05 MED ORDER — EPHEDRINE 5 MG/ML INJ: 10.0000 mg | INTRAVENOUS | Status: DC | PRN

## 2019-06-05 MED ORDER — FENTANYL-BUPIVACAINE-NACL 0.5-0.125-0.9 MG/250ML-% EP SOLN
12.0000 mL/h | EPIDURAL | Status: DC | PRN
  Administered 2019-06-05: 12 mL/h via EPIDURAL
  Filled 2019-06-05 (×2): qty 250

## 2019-06-05 MED ORDER — ACETAMINOPHEN 500 MG PO TABS
1000.0000 mg | ORAL_TABLET | Freq: Once | ORAL | Status: AC
  Administered 2019-06-05: 19:00:00 1000 mg via ORAL
  Filled 2019-06-05: qty 2

## 2019-06-05 MED ORDER — LIDOCAINE-EPINEPHRINE (PF) 2 %-1:200000 IJ SOLN
INTRAMUSCULAR | Status: DC | PRN
  Administered 2019-06-05 (×2): 2 mL via EPIDURAL

## 2019-06-05 NOTE — Anesthesia Preprocedure Evaluation (Signed)
Anesthesia Evaluation  Patient identified by MRN, date of birth, ID band Patient awake    Reviewed: Allergy & Precautions, NPO status , Patient's Chart, lab work & pertinent test results  Airway Mallampati: II  TM Distance: >3 FB Neck ROM: Full    Dental no notable dental hx.    Pulmonary neg pulmonary ROS, former smoker,    Pulmonary exam normal breath sounds clear to auscultation       Cardiovascular negative cardio ROS Normal cardiovascular exam Rhythm:Regular Rate:Normal     Neuro/Psych negative neurological ROS  negative psych ROS   GI/Hepatic negative GI ROS, Neg liver ROS,   Endo/Other  diabetes, GestationalMorbid obesity  Renal/GU negative Renal ROS  negative genitourinary   Musculoskeletal negative musculoskeletal ROS (+)   Abdominal   Peds  Hematology  (+) Blood dyscrasia (Hgb 9.4), anemia ,   Anesthesia Other Findings   Reproductive/Obstetrics (+) Pregnancy                             Anesthesia Physical Anesthesia Plan  ASA: III  Anesthesia Plan: Epidural   Post-op Pain Management:    Induction:   PONV Risk Score and Plan: Treatment may vary due to age or medical condition  Airway Management Planned: Natural Airway  Additional Equipment:   Intra-op Plan:   Post-operative Plan:   Informed Consent: I have reviewed the patients History and Physical, chart, labs and discussed the procedure including the risks, benefits and alternatives for the proposed anesthesia with the patient or authorized representative who has indicated his/her understanding and acceptance.       Plan Discussed with: Anesthesiologist  Anesthesia Plan Comments: (Patient identified. Risks, benefits, options discussed with patient including but not limited to bleeding, infection, nerve damage, paralysis, failed block, incomplete pain control, headache, blood pressure changes, nausea,  vomiting, reactions to medication, itching, and post partum back pain. Confirmed with bedside nurse the patient's most recent platelet count. Confirmed with the patient that they are not taking any anticoagulation, have any bleeding history or any family history of bleeding disorders. Patient expressed understanding and wishes to proceed. All questions were answered. )        Anesthesia Quick Evaluation

## 2019-06-05 NOTE — Progress Notes (Signed)
Hypoglycemic Event  CBG: 61  Treatment: 4oz Juice Symptoms: None  Follow-up CBG: Time:1506 CBG Result:70   Possible Reasons for Event: Inadequate meal intake  Comments/MD notified:Dr. Pedro Earls, Denyce Robert

## 2019-06-05 NOTE — Progress Notes (Signed)
Labor Progress Note Carol Greene is a 33 y.o. G3P2002 at [redacted]w[redacted]d presented for SOL after PPROM S: s/p epidural, doing well. CTX q5-6 min  O:  BP (!) 115/48   Pulse 86   Temp 98.9 F (37.2 C)   Resp 16   Ht 5\' 4"  (1.626 m)   Wt 103.9 kg   LMP 10/28/2018 (Approximate)   SpO2 100%   BMI 39.31 kg/m  EFM: 135bpm/moderate variability/+accels, no decels  CVE: Dilation: 6.5 Effacement (%): 70 Cervical Position: Posterior Station: -3 Presentation: Vertex Exam by:: Arty Baumgartner RN   A&P: 33 y.o. G3P2002 [redacted]w[redacted]d SOL after PPROM. #Labor: Progressing well w/o augmentation. Will continue to manage expectantly. Anticipate vaginal delivery.  #Pain: epidural #FWB: Cat 1 FHT #GBS positive via PCR. S/p IV PenG x1 dose, cont q4h.  #A1gDM: Check q2h in active labor.  Demetrius Revel, MD 2:34 AM

## 2019-06-05 NOTE — Progress Notes (Addendum)
Carol Greene is a 33 y.o. G3P2002 at [redacted]w[redacted]d admitted for PPROM  Subjective: Comfortable with epidural in place  Objective: BP (!) 93/55   Pulse 82   Temp 98.8 F (37.1 C) (Oral)   Resp 16   Ht 5\' 4"  (1.626 m)   Wt 103.9 kg   LMP 10/28/2018 (Approximate)   SpO2 100%   BMI 39.31 kg/m  No intake/output data recorded.  FHT:  FHR: 135-145 bpm, variability: moderate,  accelerations:  Present,  decelerations:  Absent UC:   Moderate on Toco  SVE:   Dilation: 6.5 Effacement (%): 70 Station: -2 Exam by:: Dr. Volanda Napoleon  Pitocin @ 16 mu/min  Labs: Lab Results  Component Value Date   WBC 9.3 06/04/2019   HGB 9.4 (L) 06/04/2019   HCT 28.0 (L) 06/04/2019   MCV 94.9 06/04/2019   PLT 203 06/04/2019    Assessment / Plan: Carol Greene is a 33 y.o G3P2002 at [redacted]w[redacted]d here for PPROM  Labor: s/p Pitocin.  IUPC placed.  Continue to titrate Pitocin as appropriate. Fetal Wellbeing:  Category I Pain Control:  Epidural I/D:  GBS positive, PCN Anticipated MOD:  Vaginal Delivery, CS as appropriate  [name] MD PGY1 Family Med Practice 06/05/2019, 10:56 AM

## 2019-06-05 NOTE — Progress Notes (Signed)
LABOR PROGRESS NOTE  Kaarin Pardy is a 33 y.o. G3P2002 at [redacted]w[redacted]d  admitted for SOL after PPROM.   Subjective: Went to room to discuss labor course with patient. She is worried about having a C-section and concerned that she needs to be doing other things to help her labor.   States that baby does feel lower. Felt more pressure on her left side. Peanut ball currently in use.   Objective: BP (!) 111/50   Pulse 96   Temp 98.8 F (37.1 C) (Oral)   Resp 20   Ht 5\' 4"  (1.626 m)   Wt 103.9 kg   LMP 10/28/2018 (Approximate)   SpO2 100%   BMI 39.31 kg/m  or  Vitals:   06/05/19 1631 06/05/19 1701 06/05/19 1731 06/05/19 1801  BP: (!) 114/59 (!) 97/47 (!) 99/51 (!) 111/50  Pulse: 89 91 97 96  Resp: 18 18 18 20   Temp:      TempSrc:      SpO2:      Weight:      Height:        Dilation: 6 Effacement (%): 70 Cervical Position: Posterior Station: -2, -1 Presentation: Vertex Exam by:: Dr. Juleen China FHT: baseline rate 135, moderate varibility, +acel, early decel Toco: q2-5 min   Labs: Lab Results  Component Value Date   WBC 9.3 06/04/2019   HGB 9.4 (L) 06/04/2019   HCT 28.0 (L) 06/04/2019   MCV 94.9 06/04/2019   PLT 203 06/04/2019    Patient Active Problem List   Diagnosis Date Noted  . Preterm premature rupture of membranes (PPROM) with unknown onset of labor 06/04/2019  . Gestational diabetes mellitus (GDM), antepartum 04/12/2019  . Obesity in pregnancy 02/11/2019  . Hx of tobacco use, presenting hazards to health 02/11/2019  . Supervision of other normal pregnancy, antepartum 01/26/2019    Assessment / Plan: 33 y.o. G3P2002 at [redacted]w[redacted]d here for SOL and PPROM.   Labor: Turned off Pitocin at 1545 when patient was up to 34u Pit with MVUs <100 with IUPC tracing normally. Discussed with patient that Pit break can help reset over saturated uterine receptors. Explained that would plan to resume around 1945 after 3 hour break. Will start Pitocin at 2 mu/min at that time. Encouraged  repositioning in bed as tolerated. Provided reassurance that no signs of intrauterine infection or fetal distress and that could continue with current plan. Defer cervical exam until resumption of Pit.  Fetal Wellbeing:  Cat I  Pain Control:  Epidural in place  Anticipated MOD:  [place] NSVD   Phill Myron, D.O. OB Fellow  06/05/2019, 6:08 PM

## 2019-06-05 NOTE — Progress Notes (Addendum)
Labor Progress Note Carol Greene is a 33 y.o. G3P2002 at [redacted]w[redacted]d presented for SOL after PPROM S: Comfortable s/p epidural. Not feeling her CTX, occurring q41min.   O:  BP (!) 121/54   Pulse 94   Temp 99 F (37.2 C) (Oral)   Resp 18   Ht 5\' 4"  (1.626 m)   Wt 103.9 kg   LMP 10/28/2018 (Approximate)   SpO2 100%   BMI 39.31 kg/m  EFM: 135/mod var/+accels, no decels  CVE: Dilation: 6.5 Effacement (%): 70 Cervical Position: Posterior Station: -2 Presentation: Vertex Exam by:: Dr. Lia Foyer   A&P: 33 y.o. G3P2002 [redacted]w[redacted]d SOL after PPROM at home around 59. #Labor: Seems to have stalled after epidural. Will start pitocin and uptitrate per protocol.  #Pain: epidural #FWB: Cat 1 FHT #GBS positive via PCR. S/p IV PenG x2 doses, cont q4h.  #A1gDM:Check q2h in active labor.  Demetrius Revel, MD 4:45 AM

## 2019-06-05 NOTE — Progress Notes (Signed)
Carol Greene is a 33 y.o. G3P2002 at [redacted]w[redacted]d admitted for PPROM  Subjective:   Objective: BP 99/60   Pulse 88   Temp 98.7 F (37.1 C) (Oral)   Resp 18   Ht 5\' 4"  (1.626 m)   Wt 103.9 kg   LMP 10/28/2018 (Approximate)   SpO2 100%   BMI 39.31 kg/m  No intake/output data recorded.  FHT:  FHR: 140 bpm, variability:Moderate,  accelerations:  present,  decelerations:  absent UC:  Moderate on Toco  SVE:   Dilation: 6 Effacement (%): 70 Station: -2, -1 Exam by:: Dr. Juleen China  Pitocin @ 16 mu/min  Labs: Lab Results  Component Value Date   WBC 9.3 06/04/2019   HGB 9.4 (L) 06/04/2019   HCT 28.0 (L) 06/04/2019   MCV 94.9 06/04/2019   PLT 203 06/04/2019    Assessment / Plan: Carol Greene is a 33 y.o G3P2002 at [redacted]w[redacted]d here for PPROM  Labor: s/p Pitocin.  IUPC .  Continue to titrate Pitocin as appropriate. Fetal Wellbeing:  Category I Pain Control:  Epidural I/D:  GBS positive, PCN Anticipated MOD:  Vaginal Delivery, CS as appropriate  [name] MD PGY1 Family Med Practice 06/05/2019, 2:58 PM

## 2019-06-05 NOTE — Anesthesia Procedure Notes (Signed)
Epidural Patient location during procedure: OB Start time: 06/05/2019 1:20 AM End time: 06/05/2019 1:35 AM  Staffing Anesthesiologist: Freddrick March, MD Performed: anesthesiologist   Preanesthetic Checklist Completed: patient identified, IV checked, risks and benefits discussed, monitors and equipment checked, pre-op evaluation and timeout performed  Epidural Patient position: sitting Prep: DuraPrep and site prepped and draped Patient monitoring: continuous pulse ox, blood pressure, heart rate and cardiac monitor Approach: midline Location: L3-L4 Injection technique: LOR air  Needle:  Needle type: Tuohy  Needle gauge: 17 G Needle length: 9 cm Needle insertion depth: 7 cm Catheter type: closed end flexible Catheter size: 19 Gauge Catheter at skin depth: 13 cm Test dose: negative  Assessment Sensory level: T8 Events: blood not aspirated, injection not painful, no injection resistance, no paresthesia and negative IV test  Additional Notes Patient identified. Risks/Benefits/Options discussed with patient including but not limited to bleeding, infection, nerve damage, paralysis, failed block, incomplete pain control, headache, blood pressure changes, nausea, vomiting, reactions to medication both or allergic, itching and postpartum back pain. Confirmed with bedside nurse the patient's most recent platelet count. Confirmed with patient that they are not currently taking any anticoagulation, have any bleeding history or any family history of bleeding disorders. Patient expressed understanding and wished to proceed. All questions were answered. Sterile technique was used throughout the entire procedure. Please see nursing notes for vital signs. Test dose was given through epidural catheter and negative prior to continuing to dose epidural or start infusion. Warning signs of high block given to the patient including shortness of breath, tingling/numbness in hands, complete motor block,  or any concerning symptoms with instructions to call for help. Patient was given instructions on fall risk and not to get out of bed. All questions and concerns addressed with instructions to call with any issues or inadequate analgesia.  Reason for block:procedure for pain

## 2019-06-05 NOTE — Progress Notes (Addendum)
LABOR PROGRESS NOTE  Dallas Scorsone is a 33 y.o. G3P2002 at [redacted]w[redacted]d  admitted for SOL after PPROM.   Subjective: Comfortable with epidural. FOB in room.   Objective: BP (!) 110/55   Pulse 77   Temp 98.6 F (37 C) (Oral)   Resp 18   Ht 5\' 4"  (1.626 m)   Wt 103.9 kg   LMP 10/28/2018 (Approximate)   SpO2 100%   BMI 39.31 kg/m  or  Vitals:   06/05/19 1101 06/05/19 1131 06/05/19 1231 06/05/19 1246  BP: (!) 108/55 (!) 114/59 (!) 110/55   Pulse: 80 83 77   Resp: 16 18 18    Temp:    98.6 F (37 C)  TempSrc:    Oral  SpO2:      Weight:      Height:        Dilation: 6 Effacement (%): 70 Cervical Position: Posterior Station: -2 Presentation: Vertex Exam by:: Dr. Juleen China FHT: baseline rate 130s, moderate varibility, +acel, no decel Toco: q2-5 min   Labs: Lab Results  Component Value Date   WBC 9.3 06/04/2019   HGB 9.4 (L) 06/04/2019   HCT 28.0 (L) 06/04/2019   MCV 94.9 06/04/2019   PLT 203 06/04/2019    Patient Active Problem List   Diagnosis Date Noted  . Preterm premature rupture of membranes (PPROM) with unknown onset of labor 06/04/2019  . Gestational diabetes mellitus (GDM), antepartum 04/12/2019  . Obesity in pregnancy 02/11/2019  . Hx of tobacco use, presenting hazards to health 02/11/2019  . Supervision of other normal pregnancy, antepartum 01/26/2019    Assessment / Plan: 33 y.o. G3P2002 at [redacted]w[redacted]d here for SOL and PPROM.   Labor: Augmenting with Pitocin. Currently at 28 mu/min. MVUs with previously placed IUPC measuring <100. AROM of forebag performed with moderate amount of clear fluid. IUPC replaced.  Fetal Wellbeing:  Cat I  Pain Control:  Epidural in place  Anticipated MOD:  NSVD   Phill Myron, D.O. OB Fellow  06/05/2019, 12:51 PM

## 2019-06-05 NOTE — Discharge Summary (Signed)
Postpartum Discharge Summary     Patient Name: Carol Greene DOB: 1986-05-03 MRN: [id number]  Date of admission: 06/04/2019 Delivering Provider: Sharene Butters D   Date of discharge: 06/07/2019  Admitting diagnosis: Preterm premature rupture of membranes (PPROM) with unknown onset of labor [O42.919] Intrauterine pregnancy: [redacted]w[redacted]d    Secondary diagnosis:  Active Problems:   Gestational diabetes mellitus (GDM), antepartum   Preterm premature rupture of membranes (PPROM) with unknown onset of labor   SVD (spontaneous vaginal delivery)  Additional problems: Anemia during pregnancy      Discharge diagnosis: Preterm Pregnancy Delivered and GDM A1                                                                                                Post partum procedures:none, pt elects Depo for contraception after counseling.  Augmentation: AROM and Pitocin  Complications: None  Hospital course:  Onset of Labor With Vaginal Delivery     33y.o. yo [id number] 310w4das admitted in Latent Labor on 06/04/2019. Patient had an uncomplicated labor course as follows:  Membrane Rupture Time/Date: 4:30 PM ,06/04/2019   Intrapartum Procedures: Episiotomy: None [1]                                         Lacerations:  None [1]  Patient had a delivery of a Viable infant. 06/05/2019  Information for the patient's newborn:  HaKecia, Swoboda0[[id number]]Delivery Method: Vaginal, Spontaneous(Filed from Delivery Summary)     Pateint had an uncomplicated postpartum course.  She is ambulating, tolerating a regular diet, passing flatus, and urinating well. Patient is discharged home in stable condition on 06/07/19.  Delivery time: 9:15 PM    Magnesium Sulfate received: No BMZ received: No Rhophylac:N/A MMR:N/A Transfusion:No  Physical exam  Vitals:   06/06/19 0930 06/06/19 1446 06/06/19 2228 06/07/19 0548  BP: 116/85 111/72 109/69 119/81  Pulse: 72 73 72 68  Resp: '16 17 18 18  ' Temp: 98.2 F  (36.8 C) 98.2 F (36.8 C) 98.3 F (36.8 C) 98 F (36.7 C)  TempSrc: Oral Axillary Oral Axillary  SpO2: 100% 100%  100%  Weight:      Height:       General: alert, cooperative and no distress Lochia: appropriate Uterine Fundus: firm Incision: N/A DVT Evaluation: No evidence of DVT seen on physical exam. No cords or calf tenderness. No significant calf/ankle edema. Labs: Lab Results  Component Value Date   WBC 10.9 (H) 06/06/2019   HGB 8.4 (L) 06/06/2019   HCT 25.6 (L) 06/06/2019   MCV 95.2 06/06/2019   PLT 194 06/06/2019   CMP Latest Ref Rng & Units 02/11/2019  Glucose 65 - 99 mg/dL 97  BUN 6 - 20 mg/dL 4(L)  Creatinine 0.57 - 1.00 mg/dL 0.54(L)  Sodium 134 - 144 mmol/L 138  Potassium 3.5 - 5.2 mmol/L 3.7  Chloride 96 - 106 mmol/L 102  CO2 20 - 29 mmol/L 20  Calcium 8.7 - 10.2 mg/dL 9.0  Total Protein 6.0 - 8.5 g/dL 6.2  Total Bilirubin 0.0 - 1.2 mg/dL <0.2  Alkaline Phos 39 - 117 IU/L 54  AST 0 - 40 IU/L 30  ALT 0 - 32 IU/L 29    Discharge instruction: per After Visit Summary and "Baby and Me Booklet".  After visit meds:  Allergies as of 06/07/2019   No Known Allergies     Medication List    STOP taking these medications   aspirin EC 81 MG tablet     TAKE these medications   Accu-Chek FastClix Lancets Misc 1 each by Percutaneous route 4 (four) times daily. ICD-10 code: O24.419   Accu-Chek Guide test strip Generic drug: glucose blood Use as instructed   Accu-Chek Guide w/Device Kit 1 kit by Does not apply route QID. ICD-10 code: O24.419   acetaminophen 325 MG tablet Commonly known as: Tylenol Take 2 tablets (650 mg total) by mouth every 6 (six) hours as needed.   ferrous sulfate 325 (65 FE) MG tablet Take 1 tablet (325 mg total) by mouth 2 (two) times daily with a meal.   ibuprofen 600 MG tablet Commonly known as: ADVIL Take 1 tablet (600 mg total) by mouth every 6 (six) hours.   One-A-Day Womens Prenatal 1 28-0.8-235 MG Caps Take 1 capsule  by mouth daily.   senna-docusate 8.6-50 MG tablet Commonly known as: Senokot-S Take 2 tablets by mouth at bedtime as needed.       Diet: carb modified diet  Activity: Advance as tolerated. Pelvic rest for 6 weeks.   Outpatient follow up:4 weeks Follow up Appt: Future Appointments  Date Time Provider [place]  07/16/2019  8:10 AM Gavin Pound, CNM [facility] None   Follow up Visit: Follow-up Information    [facility]. Schedule an appointment as soon as possible for a visit in 4 week(s).   Specialty: Obstetrics and Gynecology Why: For pp visit and GDM testing. Contact information: [place] [phone]           Please schedule this patient for Postpartum visit in: 4 weeks with the following provider: Any provider For C/S patients schedule nurse incision check in weeks 2 weeks: no Low risk pregnancy complicated by: anemia  Delivery mode:  SVD Anticipated Birth Control:  Depo given prior to discharge home  PP Procedures needed: 2 hour GTT  Schedule Integrated BH visit: no   Newborn Data: Live born female  Birth Weight: 5 lb 14.4 oz (2676 g) APGAR: 7, 9  Newborn Delivery   Birth date/time: 06/05/2019 21:15:00 Delivery type: Vaginal, Spontaneous      Baby Feeding: Bottle Disposition:home with mother  I confirm that I have verified the information documented in the medical student's note and that I have also personally reperformed the history, physical exam and all medical decision making activities of this service and have verified that all service and findings are accurately documented in this student's note.   06/07/2019 Clarisa Fling, NP

## 2019-06-06 ENCOUNTER — Encounter (HOSPITAL_COMMUNITY): Payer: Self-pay | Admitting: Student

## 2019-06-06 LAB — CBC
HCT: 25.6 % — ABNORMAL LOW (ref 36.0–46.0)
Hemoglobin: 8.4 g/dL — ABNORMAL LOW (ref 12.0–15.0)
MCH: 31.2 pg (ref 26.0–34.0)
MCHC: 32.8 g/dL (ref 30.0–36.0)
MCV: 95.2 fL (ref 80.0–100.0)
Platelets: 194 10*3/uL (ref 150–400)
RBC: 2.69 MIL/uL — ABNORMAL LOW (ref 3.87–5.11)
RDW: 12.3 % (ref 11.5–15.5)
WBC: 10.9 10*3/uL — ABNORMAL HIGH (ref 4.0–10.5)
nRBC: 0 % (ref 0.0–0.2)

## 2019-06-06 MED ORDER — PRENATAL MULTIVITAMIN CH
1.0000 | ORAL_TABLET | Freq: Every day | ORAL | Status: DC
  Administered 2019-06-06 – 2019-06-07 (×2): 1 via ORAL
  Filled 2019-06-06 (×2): qty 1

## 2019-06-06 MED ORDER — SIMETHICONE 80 MG PO CHEW: 80.0000 mg | CHEWABLE_TABLET | ORAL | Status: DC | PRN

## 2019-06-06 MED ORDER — DIBUCAINE (PERIANAL) 1 % EX OINT: 1.0000 "application " | TOPICAL_OINTMENT | CUTANEOUS | Status: DC | PRN

## 2019-06-06 MED ORDER — FERROUS SULFATE 325 (65 FE) MG PO TABS
325.0000 mg | ORAL_TABLET | Freq: Every day | ORAL | Status: DC
  Administered 2019-06-06 – 2019-06-07 (×2): 325 mg via ORAL
  Filled 2019-06-06 (×2): qty 1

## 2019-06-06 MED ORDER — ZOLPIDEM TARTRATE 5 MG PO TABS: 5.0000 mg | ORAL_TABLET | Freq: Every evening | ORAL | Status: DC | PRN

## 2019-06-06 MED ORDER — IBUPROFEN 600 MG PO TABS
600.0000 mg | ORAL_TABLET | Freq: Four times a day (QID) | ORAL | Status: DC
  Administered 2019-06-06 – 2019-06-07 (×7): 600 mg via ORAL
  Filled 2019-06-06 (×7): qty 1

## 2019-06-06 MED ORDER — SENNOSIDES-DOCUSATE SODIUM 8.6-50 MG PO TABS
2.0000 | ORAL_TABLET | ORAL | Status: DC
  Filled 2019-06-06: qty 2

## 2019-06-06 MED ORDER — COCONUT OIL OIL: 1.0000 "application " | TOPICAL_OIL | Status: DC | PRN

## 2019-06-06 MED ORDER — ACETAMINOPHEN 325 MG PO TABS
650.0000 mg | ORAL_TABLET | ORAL | Status: DC | PRN
  Administered 2019-06-06: 650 mg via ORAL
  Filled 2019-06-06: qty 2

## 2019-06-06 MED ORDER — WITCH HAZEL-GLYCERIN EX PADS: 1.0000 "application " | MEDICATED_PAD | CUTANEOUS | Status: DC | PRN

## 2019-06-06 MED ORDER — TETANUS-DIPHTH-ACELL PERTUSSIS 5-2.5-18.5 LF-MCG/0.5 IM SUSP: 0.5000 mL | Freq: Once | INTRAMUSCULAR | Status: DC

## 2019-06-06 MED ORDER — ONDANSETRON HCL 4 MG PO TABS: 4.0000 mg | ORAL_TABLET | ORAL | Status: DC | PRN

## 2019-06-06 MED ORDER — BENZOCAINE-MENTHOL 20-0.5 % EX AERO: 1.0000 "application " | INHALATION_SPRAY | CUTANEOUS | Status: DC | PRN

## 2019-06-06 MED ORDER — OXYCODONE HCL 5 MG PO TABS
5.0000 mg | ORAL_TABLET | ORAL | Status: DC | PRN
  Administered 2019-06-06 – 2019-06-07 (×3): 5 mg via ORAL
  Filled 2019-06-06 (×3): qty 1

## 2019-06-06 MED ORDER — ONDANSETRON HCL 4 MG/2ML IJ SOLN: 4.0000 mg | INTRAMUSCULAR | Status: DC | PRN

## 2019-06-06 MED ORDER — DIPHENHYDRAMINE HCL 25 MG PO CAPS: 25.0000 mg | ORAL_CAPSULE | Freq: Four times a day (QID) | ORAL | Status: DC | PRN

## 2019-06-06 NOTE — Progress Notes (Addendum)
POSTPARTUM PROGRESS NOTE  Post Partum Day 1  Subjective:  Carol Greene is a 33 y.o. [id number] s/p SVD at [redacted]w[redacted]d.  No acute events overnight.  Pt denies problems with ambulating, voiding or po intake.  She denies nausea or vomiting.  Pain is poorly controlled- patient reports she is currently taking ibuprofen.  She has had flatus. She has not had bowel movement.  Lochia Moderate.   Objective: Blood pressure 110/79, pulse 69, temperature 97.6 F (36.4 C), temperature source Rectal, resp. rate 19, height 5\' 4"  (1.626 m), weight 103.9 kg, last menstrual period 10/28/2018, SpO2 100 %, unknown if currently breastfeeding.  Physical Exam:  General: alert, cooperative and no distress Abdomen: soft, nontender,  Uterine Fundus: firm, appropriately tender DVT Evaluation: No calf swelling or tenderness Extremities: no edema  Recent Labs    06/04/19 2247 06/06/19 0245  HGB 9.4* 8.4*  HCT 28.0* 25.6*    Assessment/Plan: Carol Greene is a 33 y.o. [id number] s/p SVD at [redacted]w[redacted]d   PPD#1 - Doing okay, patient pain is not well controlled ordered oxycodone to add with tylenol order to control pain Contraception: Plans nexplanon- to be placed tomorrow prior to discharge  Feeding: Formula Dispo: Plan for discharge tomorrow. Anemia: initiated PO iron during hospital course, patient reports not needing refill because she has full bottle at home that she picked up last week from pharmacy.    LOS: 2 days   Herby Abraham 06/06/2019, 8:23 AM

## 2019-06-06 NOTE — Anesthesia Postprocedure Evaluation (Signed)
Anesthesia Post Note  Patient: Carol Greene  Procedure(s) Performed: AN AD HOC LABOR EPIDURAL     Patient location during evaluation: Mother Baby Anesthesia Type: Epidural Level of consciousness: awake and alert Pain management: pain level controlled Vital Signs Assessment: post-procedure vital signs reviewed and stable Respiratory status: spontaneous breathing, nonlabored ventilation and respiratory function stable Cardiovascular status: stable Postop Assessment: no headache, no backache and epidural receding Anesthetic complications: no    Last Vitals:  Vitals:   06/06/19 0126 06/06/19 0537  BP: 121/70 110/79  Pulse: 86 69  Resp: 18 19  Temp: 36.7 C 36.4 C  SpO2: 100% 100%    Last Pain:  Vitals:   06/06/19 0538  TempSrc:   PainSc: 6    Pain Goal: Patients Stated Pain Goal: 4 (06/04/19 2147)                 [name]

## 2019-06-07 MED ORDER — ACETAMINOPHEN 325 MG PO TABS: 650.0000 mg | ORAL_TABLET | Freq: Four times a day (QID) | ORAL | 0 refills | Status: AC | PRN

## 2019-06-07 MED ORDER — LIDOCAINE HCL 1 % IJ SOLN
0.0000 mL | Freq: Once | INTRAMUSCULAR | Status: DC | PRN
  Filled 2019-06-07: qty 20

## 2019-06-07 MED ORDER — SENNOSIDES-DOCUSATE SODIUM 8.6-50 MG PO TABS: 2.0000 | ORAL_TABLET | Freq: Every evening | ORAL | 0 refills | Status: AC | PRN

## 2019-06-07 MED ORDER — ETONOGESTREL 68 MG ~~LOC~~ IMPL
68.0000 mg | DRUG_IMPLANT | Freq: Once | SUBCUTANEOUS | Status: DC
  Filled 2019-06-07: qty 1

## 2019-06-07 MED ORDER — MEDROXYPROGESTERONE ACETATE 150 MG/ML IM SUSP
150.0000 mg | Freq: Once | INTRAMUSCULAR | Status: AC
  Administered 2019-06-07: 11:00:00 150 mg via INTRAMUSCULAR
  Filled 2019-06-07: qty 1

## 2019-06-07 MED ORDER — IBUPROFEN 600 MG PO TABS: 600.0000 mg | ORAL_TABLET | Freq: Four times a day (QID) | ORAL | 0 refills | Status: AC

## 2019-06-07 NOTE — Discharge Instructions (Signed)

## 2019-06-09 ENCOUNTER — Ambulatory Visit (HOSPITAL_COMMUNITY): Payer: Medicaid Other

## 2019-06-09 ENCOUNTER — Encounter: Payer: Medicaid Other | Admitting: Student

## 2019-06-24 ENCOUNTER — Encounter: Payer: Medicaid Other | Admitting: Obstetrics and Gynecology

## 2019-07-01 ENCOUNTER — Encounter: Payer: Medicaid Other | Admitting: Obstetrics and Gynecology

## 2019-07-16 ENCOUNTER — Ambulatory Visit (INDEPENDENT_AMBULATORY_CARE_PROVIDER_SITE_OTHER): Payer: Medicaid Other

## 2019-07-16 ENCOUNTER — Other Ambulatory Visit: Payer: Self-pay

## 2019-07-16 DIAGNOSIS — Z1389 Encounter for screening for other disorder: Secondary | ICD-10-CM | POA: Diagnosis not present

## 2019-07-16 DIAGNOSIS — Z3042 Encounter for surveillance of injectable contraceptive: Secondary | ICD-10-CM | POA: Insufficient documentation

## 2019-07-16 DIAGNOSIS — O24419 Gestational diabetes mellitus in pregnancy, unspecified control: Secondary | ICD-10-CM

## 2019-07-16 NOTE — Progress Notes (Signed)
   Post Partum Exam  Carol Greene is a 34 y.o. [phone] female who presents for a postpartum visit. She is 5 weeks postpartum following a spontaneous vaginal delivery. I have fully reviewed the prenatal and intrapartum course. The delivery was at 35 gestational weeks.  Anesthesia: epidural. Postpartum course has been uncomplicated. Baby's course has been uncomplicated. Baby is feeding by bottle - Similac Neosure. Bleeding staining only. Bowel function is normal. Bladder function is normal. Patient is not sexually active. Contraception method is Depo-Provera injections. Postpartum depression screening:neg; score 2 The following portions of the patient's history were reviewed and updated as appropriate: allergies, current medications, past family history, past medical history, past social history, past surgical history and problem list. Last pap smear done 02/11/2019 and was Normal with +HPV  Patient reports that she is doing well.  No feelings of depression. She states she has taken Depo Provera in the past and was satisfied with the method.  Patient reports she gets assistance/support from [facility]. Patient reports that she ate some cereal last night, but did not eat this morning.  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   Review of Systems A comprehensive review of systems was negative.    Objective:  Last menstrual period 10/28/2018, unknown if currently breastfeeding.  General:  alert, cooperative and no distress   Breasts:  Not examined  Lungs: clear to auscultation bilaterally  Heart:  regular rate and rhythm  Abdomen: normal findings: bowel sounds normal   Vulva:  not evaluated  Vagina: not evaluated  Cervix:  not evaluated  Corpus: not examined  Adnexa:  not evaluated  Rectal Exam: Not performed.        Assessment:   34 year old 5 weeks Postpartum Normal Involution Depo Provera GDM-A1  Plan:   -Patient informed of need to return for GTT test.  -Okay  to resume normal activities including exercise, work, and sex as desired. -Given RTW note for Feb 8th -Informed that she can return to this office Q12 weeks for Depo Provera injections.  -Review of chart shows pap with +HPV, will need repeat pap in Aug/Sept 2021 -RTO for testing and follow up as appropriate  Cherre Robins MSN, CNM Advanced Practice Provider, [facility] for [facility]

## 2019-07-27 ENCOUNTER — Other Ambulatory Visit: Payer: Self-pay

## 2019-07-27 ENCOUNTER — Other Ambulatory Visit (INDEPENDENT_AMBULATORY_CARE_PROVIDER_SITE_OTHER): Payer: Medicaid Other | Admitting: *Deleted

## 2019-07-27 DIAGNOSIS — O24439 Gestational diabetes mellitus in the puerperium, unspecified control: Secondary | ICD-10-CM | POA: Diagnosis not present

## 2019-07-27 NOTE — Progress Notes (Signed)
   Patient in clinic for her postpartum 2 hr gtt.  Clovis Pu, RN

## 2019-07-28 LAB — GLUCOSE TOLERANCE, 2 HOURS W/ 1HR
Glucose, 1 hour: 138 mg/dL (ref 65–179)
Glucose, 2 hour: 77 mg/dL (ref 65–152)
Glucose, Fasting: 88 mg/dL (ref 65–91)

## 2019-08-23 ENCOUNTER — Ambulatory Visit: Payer: Medicaid Other

## 2019-08-25 ENCOUNTER — Other Ambulatory Visit: Payer: Self-pay

## 2019-08-25 ENCOUNTER — Ambulatory Visit (INDEPENDENT_AMBULATORY_CARE_PROVIDER_SITE_OTHER): Payer: Medicaid Other | Admitting: *Deleted

## 2019-08-25 VITALS — BP 129/77 | HR 86 | Temp 97.9°F | Wt 230.0 lb

## 2019-08-25 DIAGNOSIS — Z3042 Encounter for surveillance of injectable contraceptive: Secondary | ICD-10-CM | POA: Diagnosis not present

## 2019-08-25 MED ORDER — MEDROXYPROGESTERONE ACETATE 150 MG/ML IM SUSP
150.0000 mg | Freq: Once | INTRAMUSCULAR | Status: AC
  Administered 2019-08-25: 11:00:00 150 mg via INTRAMUSCULAR

## 2019-08-25 NOTE — Progress Notes (Signed)
   Subjective:  Pt in for Depo Provera injection.    Objective: Need for contraception. No unusual complaints.    Assessment: Pt tolerated Depo injection. Depo given Left Deltoid.   Plan:  Next injection due May 26-November 24, 2019.    Clovis Pu, RN

## 2019-09-06 DIAGNOSIS — H40033 Anatomical narrow angle, bilateral: Secondary | ICD-10-CM | POA: Diagnosis not present

## 2019-09-06 DIAGNOSIS — H16223 Keratoconjunctivitis sicca, not specified as Sjogren's, bilateral: Secondary | ICD-10-CM | POA: Diagnosis not present

## 2019-09-26 DIAGNOSIS — H5213 Myopia, bilateral: Secondary | ICD-10-CM | POA: Diagnosis not present

## 2019-10-08 DIAGNOSIS — H5203 Hypermetropia, bilateral: Secondary | ICD-10-CM | POA: Diagnosis not present

## 2019-10-08 DIAGNOSIS — H1013 Acute atopic conjunctivitis, bilateral: Secondary | ICD-10-CM | POA: Diagnosis not present

## 2019-11-16 ENCOUNTER — Ambulatory Visit: Payer: Medicaid Other

## 2020-04-04 IMAGING — US US MFM OB DETAIL+14 WK
1 series · 12 of 28 positions shown · non-contrast
Comparison: none

[Series 1: us mfm ob detail+14 wk · 12 of 92 slices shown]
[im 4/92]
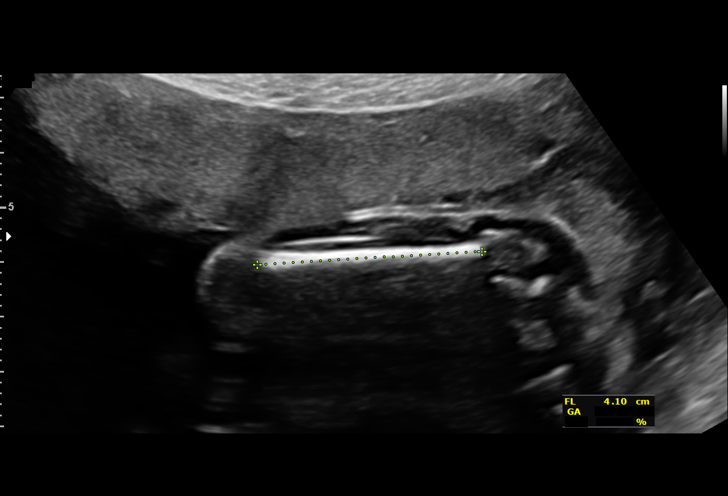
[im 11/92]
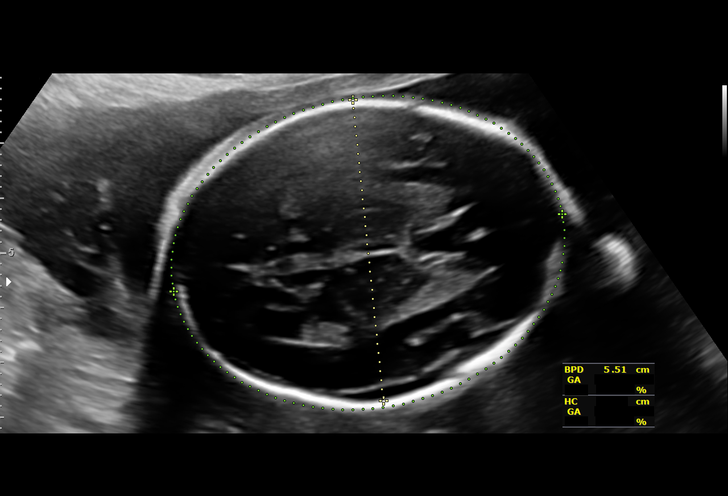
[im 17/92]
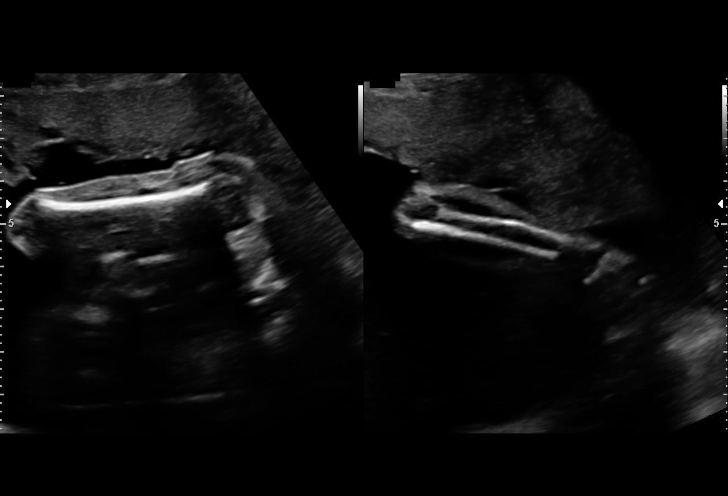
[im 27/92]
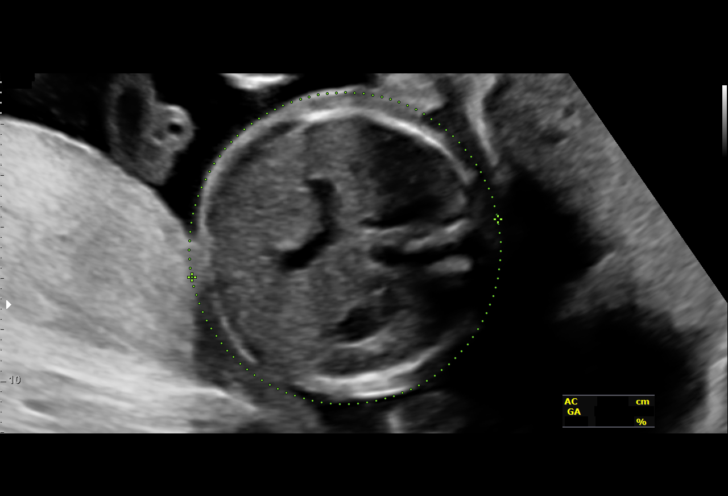
[im 34/92]
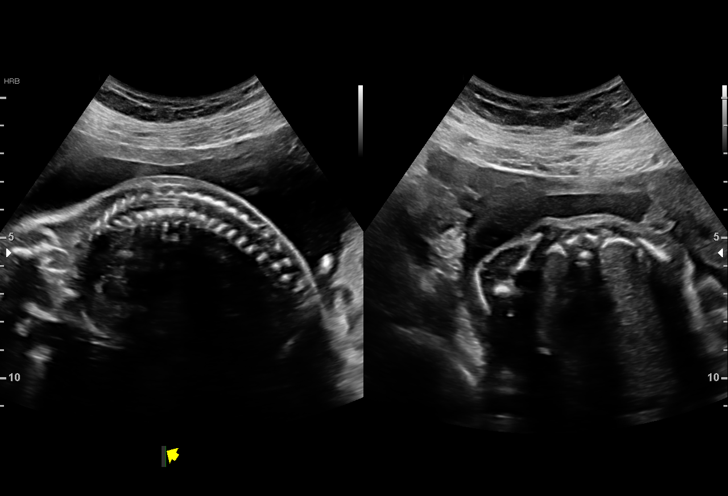
[im 41/92]
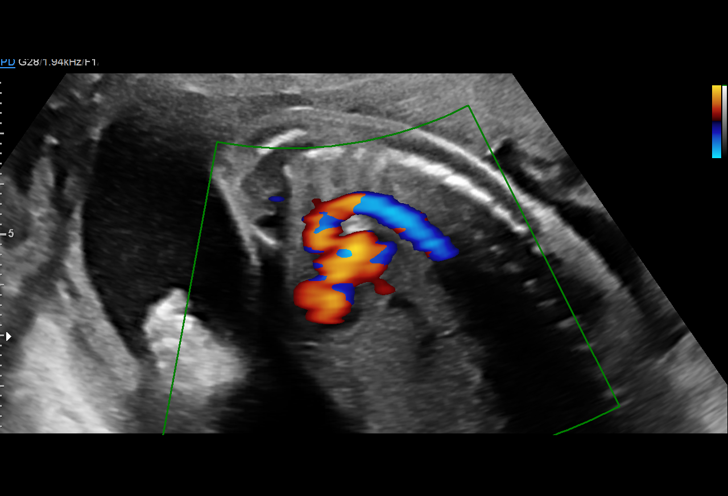
[im 51/92]
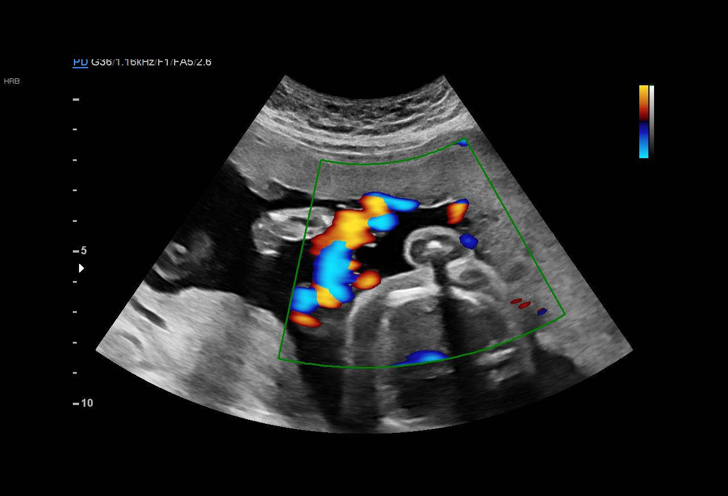
[im 58/92]
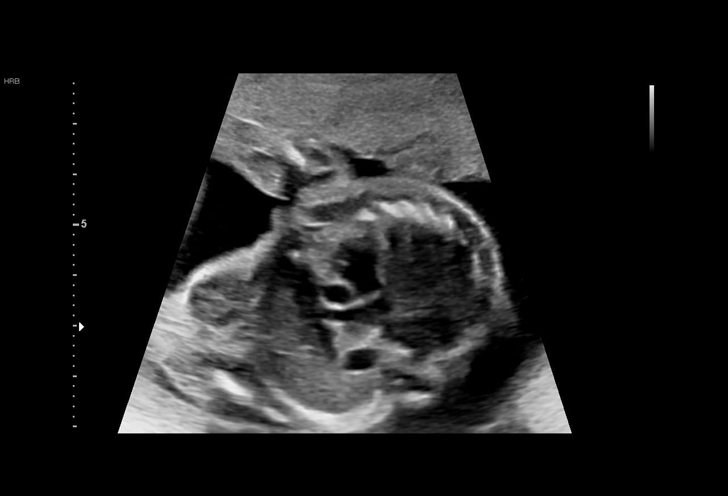
[im 65/92]
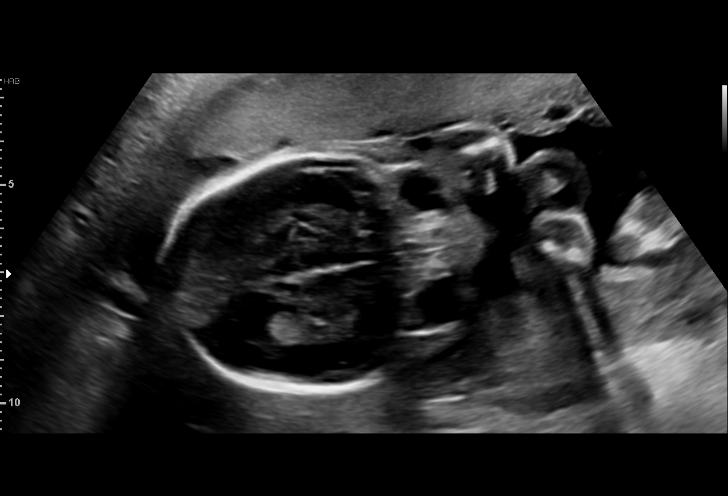
[im 75/92]
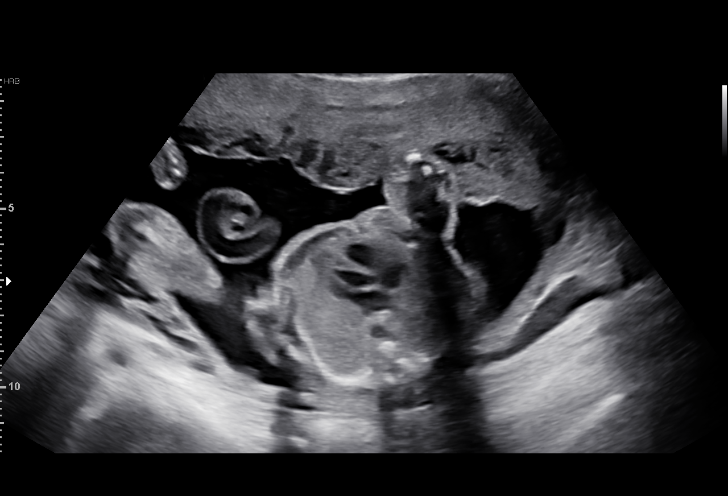
[im 81/92]
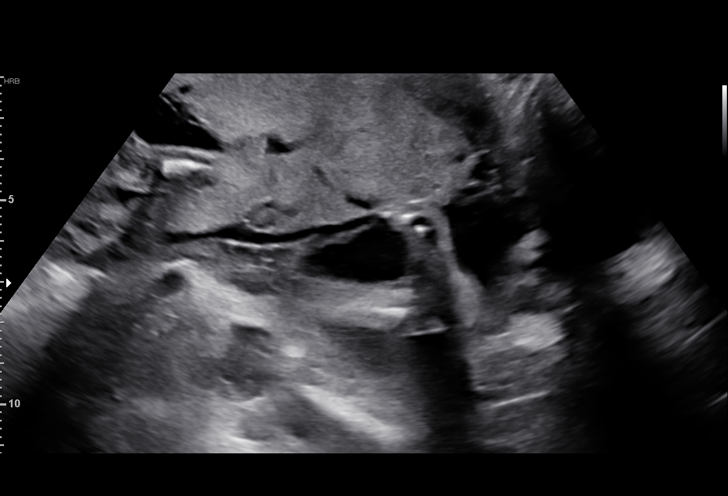
[im 88/92]
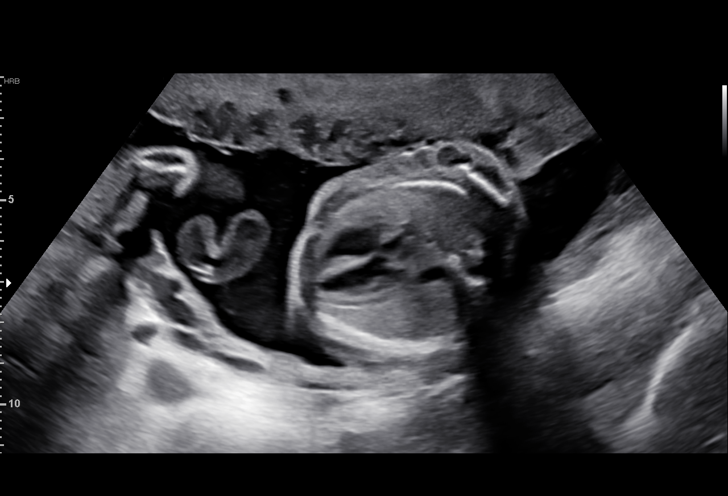

[12 of 28 positions shown; findings below may reference images not displayed]

1  US MFM OB DETAIL +14 WK              76811.01     BULEGON
                                                       DIMBECK
 ----------------------------------------------------------------------

 ----------------------------------------------------------------------
Indications

  Obesity complicating pregnancy, second
  trimester (BMI 38)
  Anemia during pregnancy in second trimester
  Abnormal biochemical screen (atypical
  finding on sex chromosomes)
  Encounter for antenatal screening for
  malformations
  23 weeks gestation of pregnancy
 ----------------------------------------------------------------------
Vital Signs

 BMI:
Fetal Evaluation

 Num Of Fetuses:         1
 Cardiac Activity:       Observed
 Presentation:           Transverse, head to maternal right
 Placenta:               Anterior
 P. Cord Insertion:      Visualized, central

 Amniotic Fluid
 AFI FV:      Within normal limits

                             Largest Pocket(cm)

Biometry

 BPD:      54.8  mm     G. Age:  22w 5d         28  %    CI:        71.84   %    70 - 86
                                                         FL/HC:      19.8   %    19.2 -
 HC:      205.8  mm     G. Age:  22w 5d         19  %    HC/AC:      1.06        1.05 -
 AC:      193.4  mm     G. Age:  24w 0d         71  %    FL/BPD:     74.3   %    71 - 87
 FL:       40.7  mm     G. Age:  23w 1d         39  %    FL/AC:      21.0   %    20 - 24
 CER:      25.9  mm     G. Age:  23w 6d         61  %
 LV:        6.2  mm
 CM:        3.3  mm

 Est. FW:     603  gm      1 lb 5 oz     62  %
OB History

 Gravidity:    3         Term:   2
 Living:       2
Gestational Age

 LMP:           19w 0d        Date:  10/28/18                 EDD:   08/04/19
 U/S Today:     23w 1d                                        EDD:   07/06/19
 Best:          23w 1d     Det. By:  U/S (03/10/19)           EDD:   07/06/19
Anatomy

 Cranium:               Appears normal         Aortic Arch:            Appears normal
 Cavum:                 Appears normal         Ductal Arch:            Appears normal
 Ventricles:            Appears normal         Diaphragm:              Appears normal
 Choroid Plexus:        Appears normal         Stomach:                Appears normal, left
                                                                       sided
 Cerebellum:            Appears normal         Abdomen:                Appears normal
 Posterior Fossa:       Appears normal         Abdominal Wall:         Appears nml (cord
                                                                       insert, abd wall)
 Nuchal Fold:           Not applicable (>20    Cord Vessels:           Appears normal (3
                        wks GA)                                        vessel cord)
 Face:                  Appears normal         Kidneys:                Appear normal
                        (orbits and Ramssis
 Lips:                  Appears normal         Bladder:                Appears normal
 Thoracic:              Appears normal         Spine:                  Ltd views no
                                                                       intracranial signs of
                                                                       NTD
 Heart:                 Appears normal         Upper Extremities:      Appears normal
                        (4CH, axis, and
                        situs)
 RVOT:                  Appears normal         Lower Extremities:      Appears normal
 LVOT:                  Appears normal

 Other:  Heels and 5th digit visualized. Female gender Open hands visualized.
         Technically difficult due to maternal habitus and fetal position.
Cervix Uterus Adnexa

 Cervix
 Length:           4.43  cm.
 Normal appearance by transabdominal scan.

 Uterus
 No abnormality visualized.

 Left Ovary
 No adnexal mass visualized.

 Right Ovary
 No adnexal mass visualized.
 Cul De Sac
 No free fluid seen.

 Adnexa
 No abnormality visualized.
Comments

 This patient was seen for a detailed fetal anatomy scan as
 her cell free DNA test indicated atypical findings on sex
 chromosomes possibly due to a maternal finding. She denies
 any significant past medical history and denies any problems
 in her current pregnancy.
 Her cell free DNA test indicated a low risk for trisomy 21, 18,
 and 13. A female fetus is predicted.
 She was informed that the fetal growth and amniotic fluid
 level were appropriate for her gestational age.
 There were no obvious fetal anomalies noted on today's
 ultrasound exam.
 The patient was informed that anomalies may be missed due
 to technical limitations. If the fetus is in a suboptimal position
 or maternal habitus is increased, visualization of the fetus in
 the maternal uterus may be impaired.
 Due to the atypical findings on her cell free DNA test, the
 patient was offered and declined an amniocentesis today for
 definitive diagnosis of fetal aneuploidy and fetal sex
 chromosome abnormalities.  She was also offered and
 declined a meeting with the genetic counselor to discuss the
 abnormal cell free DNA test results.
 Follow-up as indicated.

## 2020-09-19 ENCOUNTER — Encounter (HOSPITAL_COMMUNITY): Payer: Self-pay

## 2020-09-19 ENCOUNTER — Emergency Department (HOSPITAL_COMMUNITY)
Admission: EM | Admit: 2020-09-19 | Discharge: 2020-09-20 | Disposition: A | Discharge status: Home / Self Care | Payer: Medicaid Other | Attending: Emergency Medicine | Admitting: Emergency Medicine

## 2020-09-19 ENCOUNTER — Other Ambulatory Visit: Payer: Self-pay

## 2020-09-19 DIAGNOSIS — S60451A Superficial foreign body of left index finger, initial encounter: Secondary | ICD-10-CM | POA: Insufficient documentation

## 2020-09-19 DIAGNOSIS — W458XXA Other foreign body or object entering through skin, initial encounter: Secondary | ICD-10-CM | POA: Diagnosis not present

## 2020-09-19 DIAGNOSIS — S6991XA Unspecified injury of right wrist, hand and finger(s), initial encounter: Secondary | ICD-10-CM | POA: Diagnosis present

## 2020-09-19 DIAGNOSIS — Z87891 Personal history of nicotine dependence: Secondary | ICD-10-CM | POA: Diagnosis not present

## 2020-09-19 MED ORDER — IBUPROFEN 400 MG PO TABS
600.0000 mg | ORAL_TABLET | Freq: Once | ORAL | Status: AC
  Administered 2020-09-19: 600 mg via ORAL
  Filled 2020-09-19: qty 1

## 2020-09-19 MED ORDER — TETANUS-DIPHTH-ACELL PERTUSSIS 5-2.5-18.5 LF-MCG/0.5 IM SUSY: 0.5000 mL | PREFILLED_SYRINGE | Freq: Once | INTRAMUSCULAR | Status: DC

## 2020-09-19 NOTE — ED Triage Notes (Signed)
Has crochet hook stuck in left index finger. Unknown tetanus.

## 2020-09-19 NOTE — ED Provider Notes (Signed)
MSE was initiated and I personally evaluated the patient and placed orders (if any) at  11:47 PM on September 19, 2020.  Today's Vitals   09/19/20 2329 09/19/20 2331  BP:  123/73  Pulse:  91  Resp:  16  Temp:  98.4 F (36.9 C)  TempSrc:  Oral  SpO2:  100%  Weight: 104.3 kg   Height: 5\' 4"  (1.626 m)   PainSc: 7     Body mass index is 39.47 kg/m.  Patient to ED with FB embedded in left index finger. Reports it is a crochet needle that was broken, but has a remaining "hook" at the end preventing her from removing it.   No imaging indicated.   Tetanus updated.   The patient appears stable so that the remainder of the MSE may be completed by another provider.   , PA-C 09/19/20 2349    11/19/20, MD 09/20/20 [phone]

## 2020-09-20 MED ORDER — LIDOCAINE HCL 2 % IJ SOLN
10.0000 mL | Freq: Once | INTRAMUSCULAR | Status: AC
  Administered 2020-09-20: 200 mg
  Filled 2020-09-20: qty 20

## 2020-09-20 MED ORDER — CEPHALEXIN 500 MG PO CAPS: 500.0000 mg | ORAL_CAPSULE | Freq: Four times a day (QID) | ORAL | 0 refills | Status: AC

## 2020-09-20 NOTE — ED Provider Notes (Signed)
[facility] EMERGENCY DEPARTMENT Provider Note   CSN: [id number] Arrival date & time: 09/19/20  2323     History Chief Complaint  Patient presents with  . Hand Pain  . penetrating object    Carol Greene is a 35 y.o. female.  35 year old female presents to the emergency department for evaluation of foreign body to the left index finger.  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  She attempted to remove it manually without success.  No medications taken prior to arrival.  Reports some discomfort at the site of puncture wound/foreign body insertion.  No numbness, paresthesias, limited range of motion of the digit.  Per chart review, Tdap updated in December 2020.  Patient right hand dominant.        Past Medical History:  Diagnosis Date  . Medical history non-contributory   . Preterm premature rupture of membranes (PPROM) with unknown onset of labor 06/04/2019    Patient Active Problem List   Diagnosis Date Noted  . Depo-Provera contraceptive status 07/16/2019  . Gestational diabetes mellitus (GDM), antepartum 04/12/2019  . Hx of tobacco use, presenting hazards to health 02/11/2019    Past Surgical History:  Procedure Laterality Date  . NO PAST SURGERIES       OB History    Gravida  3   Para  3   Term  2   Preterm  1   AB      Living  3     SAB      IAB      Ectopic      Multiple  0   Live Births  3           History reviewed. No pertinent family history.  Social History   Tobacco Use  . Smoking status: Former Research scientist (life sciences)  . Smokeless tobacco: Never Used  Vaping Use  . Vaping Use: Never used  Substance Use Topics  . Alcohol use: Not Currently  . Drug use: Not Currently    Types: Marijuana    Comment: quit about one year ago    Home Medications Prior to Admission medications   Medication Sig Start Date End Date Taking? Authorizing Provider  cephALEXin (KEFLEX) 500 MG capsule Take  1 capsule (500 mg total) by mouth 4 (four) times daily for 5 days. 09/20/20 09/25/20 Yes Antonietta Breach, PA-C  Accu-Chek FastClix Lancets MISC 1 each by Percutaneous route 4 (four) times daily. ICD-10 code: O14.419 Patient not taking: Reported on 07/16/2019 04/12/19   Gavin Pound, CNM  acetaminophen (TYLENOL) 325 MG tablet Take 2 tablets (650 mg total) by mouth every 6 (six) hours as needed. 06/07/19   Nugent, Gerrie Nordmann, NP  Blood Glucose Monitoring Suppl (ACCU-CHEK GUIDE) w/Device KIT 1 kit by Does not apply route QID. ICD-10 code: O19.419 Patient not taking: Reported on 07/16/2019 04/12/19   Gavin Pound, CNM  ferrous sulfate 325 (65 FE) MG tablet Take 1 tablet (325 mg total) by mouth 2 (two) times daily with a meal. 04/11/19   Gavin Pound, CNM  glucose blood (ACCU-CHEK GUIDE) test strip Use as instructed Patient not taking: Reported on 07/16/2019 04/22/19   Sloan Leiter, MD  ibuprofen (ADVIL) 600 MG tablet Take 1 tablet (600 mg total) by mouth every 6 (six) hours. Patient not taking: Reported on 07/16/2019 06/07/19   Demetrius Revel, MD  Prenat-Fe Carbonyl-FA-Omega 3 (ONE-A-DAY WOMENS PRENATAL 1) 28-0.8-235 MG CAPS Take 1 capsule by mouth  daily. Patient not taking: Reported on 07/16/2019 04/07/19   Gavin Pound, CNM  senna-docusate (SENOKOT-S) 8.6-50 MG tablet Take 2 tablets by mouth at bedtime as needed. Patient not taking: Reported on 07/16/2019 06/07/19   Nugent, Gerrie Nordmann, NP    Allergies    Patient has no known allergies.  Review of Systems   Review of Systems  Ten systems reviewed and are negative for acute change, except as noted in the HPI.    Physical Exam Updated Vital Signs BP 123/73 (BP Location: Right Arm)   Pulse 91   Temp 98.4 F (36.9 C) (Oral)   Resp 16   Ht '5\' 4"'  (1.626 m)   Wt 104.3 kg   SpO2 100%   BMI 39.47 kg/m   Physical Exam Vitals and nursing note reviewed.  Constitutional:      General: She is not in acute distress.    Appearance: She is well-developed.  She is not diaphoretic.     Comments: Nontoxic-appearing and in no distress  HENT:     Head: Normocephalic and atraumatic.  Eyes:     General: No scleral icterus.    Conjunctiva/sclera: Conjunctivae normal.  Cardiovascular:     Rate and Rhythm: Normal rate and regular rhythm.     Pulses: Normal pulses.     Comments: Capillary refill brisk in all digits of the left hand. Pulmonary:     Effort: Pulmonary effort is normal. No respiratory distress.  Musculoskeletal:        General: Normal range of motion.     Cervical back: Normal range of motion.     Comments: Foreign body protruding from the fat pad of the left index finger consistent with end of crochet needle.  No appreciable bony deformity.  There is mild tenderness at the site of foreign body puncture.  No bleeding.  Skin:    General: Skin is warm and dry.     Coloration: Skin is not pale.     Findings: No erythema or rash.  Neurological:     Mental Status: She is alert and oriented to person, place, and time.     Coordination: Coordination normal.     Comments: Sensation to light touch intact in all digits of the left hand.  Psychiatric:        Behavior: Behavior normal.     ED Results / Procedures / Treatments   Labs (all labs ordered are listed, but only abnormal results are displayed) Labs Reviewed - No data to display  EKG None  Radiology No results found.  Procedures .Foreign Body Removal  Date/Time: 09/20/2020 1:52 AM Performed by: Antonietta Breach, PA-C Authorized by: Antonietta Breach, PA-C  Consent: The procedure was performed in an emergent situation. Verbal consent obtained. Risks and benefits: risks, benefits and alternatives were discussed Consent given by: patient Patient understanding: patient states understanding of the procedure being performed Patient consent: the patient's understanding of the procedure matches consent given Procedure consent: procedure consent matches procedure scheduled Relevant  documents: relevant documents present and verified Imaging studies: imaging studies available Required items: required blood products, implants, devices, and special equipment available Patient identity confirmed: verbally with patient and arm band Time out: Immediately prior to procedure a "time out" was called to verify the correct patient, procedure, equipment, support staff and site/side marked as required. Body area: skin General location: upper extremity Location details: left index finger Anesthesia: digital block  Anesthesia: Local Anesthetic: lidocaine 2% without epinephrine Anesthetic total: 8 mL  Sedation: Patient  sedated: no  Patient cooperative: yes Removal mechanism: forceps Dressing: dressing applied Complexity: simple 1 objects recovered. Objects recovered: dreadlocks crochet hook Post-procedure assessment: foreign body removed Patient tolerance: patient tolerated the procedure well with no immediate complications     Medications Ordered in ED Medications  Tdap (BOOSTRIX) injection 0.5 mL (0.5 mLs Intramuscular Not Given 09/20/20 0014)  ibuprofen (ADVIL) tablet 600 mg (600 mg Oral Given 09/19/20 2357)  lidocaine (XYLOCAINE) 2 % (with pres) injection 200 mg (200 mg Infiltration Given by Other 09/20/20 0103)    ED Course  I have reviewed the triage vital signs and the nursing notes.  Pertinent labs & imaging results that were available during my care of the patient were reviewed by me and considered in my medical decision making (see chart for details).    MDM Rules/Calculators/A&P                          35 year old female presents to the emergency department with foreign body in her left index finger.  This was removed without complications following digital block.  Neurovascularly intact pre and post procedure.  Placed on Keflex.  Tdap UTD.  Given instructions on wound care as well as reasons to return to the ED.  Patient discharged in stable condition with no  unaddressed concerns.   Final Clinical Impression(s) / ED Diagnoses Final diagnoses:  Foreign body of left index finger    Rx / DC Orders ED Discharge Orders         Ordered    cephALEXin (KEFLEX) 500 MG capsule  4 times daily        09/20/20 0154           Antonietta Breach, PA-C 09/20/20 [id number]    Tegeler, Gwenyth Allegra, MD 09/20/20 [phone]

## 2020-09-20 NOTE — Discharge Instructions (Signed)
Take Keflex as prescribed until finished.  We recommend ibuprofen or Tylenol for management of discomfort.  Keep your wound covered for the next 24 to 48 hours.  After this time, ensure it stays clean with mild soap and warm water.  Return for new or concerning symptoms.

## 2020-09-21 ENCOUNTER — Telehealth: Payer: Self-pay | Admitting: *Deleted

## 2020-09-21 NOTE — Telephone Encounter (Signed)
Transition Care Management Follow-up Telephone Call  Date of discharge and from where: 09/20/2020 - [facility]  How have you been since you were released from the hospital? "Better than yesterday"  Any questions or concerns? No  Items Reviewed:  Did the pt receive and understand the discharge instructions provided? Yes   Medications obtained and verified? Yes   Other? No   Any new allergies since your discharge? No   Dietary orders reviewed? No  Do you have support at home? Yes    Functional Questionnaire: (I = Independent and D = Dependent) ADLs: I  Bathing/Dressing- I  Meal Prep- I  Eating- I  Maintaining continence- I  Transferring/Ambulation- I  Managing Meds- I  Follow up appointments reviewed:   PCP Hospital f/u appt confirmed? No    Specialist Hospital f/u appt confirmed? No    Are transportation arrangements needed? No   If their condition worsens, is the pt aware to call PCP or go to the Emergency Dept.? Yes  Was the patient provided with contact information for the PCP's office or ED? Yes  Was to pt encouraged to call back with questions or concerns? Yes
